# Patient Record
Sex: Female | Born: 1980 | Race: White | Hispanic: No | Marital: Single | Smoking: Current every day smoker
Health system: Southern US, Community
[De-identification: ages and names within clinical notes are randomized; demographics above are authoritative.]

## PROBLEM LIST (undated history)

## (undated) DIAGNOSIS — F419 Anxiety disorder, unspecified: Secondary | ICD-10-CM

## (undated) DIAGNOSIS — L309 Dermatitis, unspecified: Secondary | ICD-10-CM

## (undated) DIAGNOSIS — G43909 Migraine, unspecified, not intractable, without status migrainosus: Secondary | ICD-10-CM

## (undated) HISTORY — DX: Migraine, unspecified, not intractable, without status migrainosus: G43.909

## (undated) HISTORY — DX: Dermatitis, unspecified: L30.9

## (undated) HISTORY — DX: Anxiety disorder, unspecified: F41.9

---
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
## 2004-03-11 ENCOUNTER — Emergency Department: Payer: Self-pay | Admitting: Emergency Medicine

## 2004-06-18 ENCOUNTER — Emergency Department: Payer: Self-pay | Admitting: Emergency Medicine

## 2006-07-06 ENCOUNTER — Emergency Department: Payer: Self-pay | Admitting: Emergency Medicine

## 2006-11-04 ENCOUNTER — Ambulatory Visit: Payer: Self-pay | Admitting: Family Medicine

## 2009-03-10 ENCOUNTER — Ambulatory Visit: Payer: Self-pay | Admitting: Internal Medicine

## 2009-03-10 ENCOUNTER — Emergency Department: Payer: Self-pay | Admitting: Emergency Medicine

## 2011-08-09 ENCOUNTER — Telehealth: Payer: Self-pay | Admitting: Internal Medicine

## 2011-08-09 MED ORDER — AZITHROMYCIN 250 MG PO TABS: ORAL_TABLET | ORAL | Status: AC

## 2011-08-09 NOTE — Telephone Encounter (Signed)
We can call her in a z-pack.  This will help reduce the risk of transmission of pertussis to others, but will not likely shorten the course. If cough severe, we can call in Tessalon.  She should be seen next week if symptoms severe/persistent.

## 2011-08-09 NOTE — Telephone Encounter (Signed)
Left mess to call office back.  Unsure pharm or pt's allergies.

## 2011-08-09 NOTE — Telephone Encounter (Signed)
Patient is working at [employer].Coughing until vomiting.

## 2011-08-09 NOTE — Telephone Encounter (Signed)
Patient informed, She has allergy to penicillin but no probs w/zpak. She did not want tessalon. I also advised her to report exposure to her work so it can be documented and followed, pt agreed.

## 2012-05-05 ENCOUNTER — Ambulatory Visit (INDEPENDENT_AMBULATORY_CARE_PROVIDER_SITE_OTHER): Payer: BC Managed Care – PPO | Admitting: Internal Medicine

## 2012-05-05 ENCOUNTER — Ambulatory Visit (INDEPENDENT_AMBULATORY_CARE_PROVIDER_SITE_OTHER)
Admission: RE | Admit: 2012-05-05 | Discharge: 2012-05-05 | Disposition: A | Discharge status: Home / Self Care | Payer: BC Managed Care – PPO | Source: Ambulatory Visit | Attending: Internal Medicine | Admitting: Internal Medicine

## 2012-05-05 ENCOUNTER — Encounter: Payer: Self-pay | Admitting: Internal Medicine

## 2012-05-05 VITALS — BP 112/78 | HR 69 | Temp 98.3°F | Resp 16 | Ht 65.75 in | Wt 215.8 lb

## 2012-05-05 DIAGNOSIS — R05 Cough: Secondary | ICD-10-CM | POA: Insufficient documentation

## 2012-05-05 DIAGNOSIS — F17200 Nicotine dependence, unspecified, uncomplicated: Secondary | ICD-10-CM

## 2012-05-05 DIAGNOSIS — G43909 Migraine, unspecified, not intractable, without status migrainosus: Secondary | ICD-10-CM | POA: Insufficient documentation

## 2012-05-05 DIAGNOSIS — Z720 Tobacco use: Secondary | ICD-10-CM

## 2012-05-05 DIAGNOSIS — R059 Cough, unspecified: Secondary | ICD-10-CM

## 2012-05-05 DIAGNOSIS — E669 Obesity, unspecified: Secondary | ICD-10-CM | POA: Insufficient documentation

## 2012-05-05 NOTE — Assessment & Plan Note (Signed)
Strongly encourage smoking cessation. Patient has used both Chantix and Wellbutrin in the past without improvement. Encouraged her to consider nicotine replacement.

## 2012-05-05 NOTE — Progress Notes (Signed)
Subjective:    Patient ID: Kristen Carey, female    DOB: [birth year], 31 y.o.   MRN: [id number]  HPI 31 year old female with history of migraine headaches presents to reestablish care. She was previously seen in our office. She has not been seen in over a year. She reports that she is generally been doing well. However, since February 2013, she has had some issues with cough. She reports that at that time she was infected with pertussis. She was treated with azithromycin. Overall, symptoms have improved however she continues to have approximately 2 episodes per day violent coughing sometimes associated with posttussive emesis. She denies hemoptysis or productive cough. She also notes occasional stridor. She denies any fever, chills, chest pain, shortness of breath either at rest or on exertion. She is a smoker and smokes about 15 cigarettes per day. She has tried Wellbutrin and Chantix to help quit smoking but these have been unsuccessful. She has a family history of lung cancer in her maternal grandmother.  In regards to migraine headaches, symptoms have been well-controlled with Treximet.  Outpatient Encounter Prescriptions as of 05/05/2012  Medication Sig Dispense Refill  . clindamycin (CLEOCIN) 2 % vaginal cream       . TREXIMET 85-500 MG per tablet        BP 112/78  Pulse 69  Temp 98.3 F (36.8 C) (Oral)  Resp 16  Ht 5' 5.75" (1.67 m)  Wt 215 lb 12 oz (97.864 kg)  BMI 35.09 kg/m2  SpO2 98%  Review of Systems  Constitutional: Negative for fever, chills, appetite change, fatigue and unexpected weight change.  HENT: Negative for ear pain, congestion, sore throat, trouble swallowing, neck pain, voice change and sinus pressure.   Eyes: Negative for visual disturbance.  Respiratory: Positive for cough. Negative for shortness of breath, wheezing and stridor.   Cardiovascular: Negative for chest pain, palpitations and leg swelling.  Gastrointestinal: Negative for nausea, vomiting,  abdominal pain, diarrhea, constipation, blood in stool, abdominal distention and anal bleeding.  Genitourinary: Negative for dysuria and flank pain.  Musculoskeletal: Negative for myalgias, arthralgias and gait problem.  Skin: Negative for color change and rash.  Neurological: Negative for dizziness and headaches.  Hematological: Negative for adenopathy. Does not bruise/bleed easily.  Psychiatric/Behavioral: Negative for suicidal ideas, sleep disturbance and dysphoric mood. The patient is not nervous/anxious.        Objective:   Physical Exam  Constitutional: She is oriented to person, place, and time. She appears well-developed and well-nourished. No distress.  HENT:  Head: Normocephalic and atraumatic.  Right Ear: External ear normal.  Left Ear: External ear normal.  Nose: Nose normal.  Mouth/Throat: Oropharynx is clear and moist. No oropharyngeal exudate.  Eyes: Conjunctivae normal are normal. Pupils are equal, round, and reactive to light. Right eye exhibits no discharge. Left eye exhibits no discharge. No scleral icterus.  Neck: Normal range of motion. Neck supple. No tracheal deviation present. No thyromegaly present.  Cardiovascular: Normal rate, regular rhythm, normal heart sounds and intact distal pulses.  Exam reveals no gallop and no friction rub.   No murmur heard. Pulmonary/Chest: Effort normal and breath sounds normal. No accessory muscle usage. Not tachypneic. No respiratory distress. She has no decreased breath sounds. She has no wheezes. She has no rhonchi. She has no rales. She exhibits no tenderness.  Abdominal: Soft. Bowel sounds are normal. She exhibits no distension and no mass. There is no tenderness. There is no guarding.  Musculoskeletal: Normal range of motion. She exhibits no  edema and no tenderness.  Lymphadenopathy:    She has no cervical adenopathy.  Neurological: She is alert and oriented to person, place, and time. No cranial nerve deficit. She exhibits  normal muscle tone. Coordination normal.  Skin: Skin is warm and dry. No rash noted. She is not diaphoretic. No erythema. No pallor.  Psychiatric: She has a normal mood and affect. Her behavior is normal. Judgment and thought content normal.          Assessment & Plan:

## 2012-05-05 NOTE — Assessment & Plan Note (Addendum)
Symptoms seem most consistent with residual cough from pertussis.  Exam is normal today. CXR normal today.  Given h/o tobacco abuse would favor getting PFTs for further evaluation.  Strongly encouraged smoking cessation given pt family h/o lung cancer.  Follow up 1 month.

## 2012-05-05 NOTE — Assessment & Plan Note (Signed)
BMI 35. Patient reports that recent lab work performed by her OB/GYN included CMP and TSH. Will request results on this.

## 2012-05-05 NOTE — Assessment & Plan Note (Signed)
Migraines are well controlled with Treximet. Will plan to continue.

## 2012-06-25 ENCOUNTER — Encounter: Payer: Self-pay | Admitting: Internal Medicine

## 2012-06-25 ENCOUNTER — Ambulatory Visit (INDEPENDENT_AMBULATORY_CARE_PROVIDER_SITE_OTHER)
Admission: RE | Admit: 2012-06-25 | Discharge: 2012-06-25 | Disposition: A | Discharge status: Home / Self Care | Payer: BC Managed Care – PPO | Source: Ambulatory Visit | Attending: Internal Medicine | Admitting: Internal Medicine

## 2012-06-25 ENCOUNTER — Ambulatory Visit (INDEPENDENT_AMBULATORY_CARE_PROVIDER_SITE_OTHER): Payer: BC Managed Care – PPO | Admitting: Internal Medicine

## 2012-06-25 VITALS — BP 128/90 | HR 80 | Temp 98.6°F | Wt 212.0 lb

## 2012-06-25 DIAGNOSIS — M542 Cervicalgia: Secondary | ICD-10-CM

## 2012-06-25 MED ORDER — CYCLOBENZAPRINE HCL 5 MG PO TABS: 5.0000 mg | ORAL_TABLET | Freq: Three times a day (TID) | ORAL | Status: DC | PRN

## 2012-06-25 MED ORDER — HYDROCODONE-ACETAMINOPHEN 5-325 MG PO TABS: 1.0000 | ORAL_TABLET | Freq: Four times a day (QID) | ORAL | Status: DC | PRN

## 2012-06-25 NOTE — Progress Notes (Signed)
  Subjective:    Patient ID: Kristen Carey, female    DOB: 1980/07/06, 32 y.o.   MRN: [id number]  HPI 31YO female presents with 1 week h/o neck pain. Woke up Friday morning with pain in back and left side of neck. Pain shoots down left shoulder. No arm numbness or weakness. No trauma, no new activites. Taking ibuprofen with no improvement and took 1 hydrocodone with minimal improvement. Decreaesd ROM. No fever, headache, or other symptoms.  Outpatient Prescriptions Prior to Visit  Medication Sig Dispense Refill  . TREXIMET 85-500 MG per tablet       . clindamycin (CLEOCIN) 2 % vaginal cream        Last reviewed on 06/25/2012  8:20 AM by Theola Sequin  BP 128/90  Pulse 80  Temp 98.6 F (37 C) (Oral)  Wt 212 lb (96.163 kg)  SpO2 98%  Review of Systems  Constitutional: Negative for fever, chills, appetite change, fatigue and unexpected weight change.  HENT: Positive for neck pain. Negative for ear pain, congestion, sore throat, trouble swallowing, neck stiffness, voice change and sinus pressure.   Eyes: Negative for visual disturbance.  Respiratory: Negative for cough, shortness of breath, wheezing and stridor.   Cardiovascular: Negative for chest pain, palpitations and leg swelling.  Gastrointestinal: Negative for nausea, vomiting, abdominal pain, diarrhea, constipation, blood in stool, abdominal distention and anal bleeding.  Genitourinary: Negative for dysuria and flank pain.  Musculoskeletal: Positive for myalgias and back pain. Negative for arthralgias and gait problem.  Skin: Negative for color change and rash.  Neurological: Negative for dizziness and headaches.  Hematological: Negative for adenopathy. Does not bruise/bleed easily.  Psychiatric/Behavioral: Negative for suicidal ideas, sleep disturbance and dysphoric mood. The patient is not nervous/anxious.        Objective:   Physical Exam  Constitutional: She is oriented to person, place, and time. She appears  well-developed and well-nourished. No distress.  HENT:  Head: Normocephalic and atraumatic.  Right Ear: External ear normal.  Left Ear: External ear normal.  Nose: Nose normal.  Mouth/Throat: Oropharynx is clear and moist.  Eyes: Conjunctivae normal are normal. Pupils are equal, round, and reactive to light. Right eye exhibits no discharge. Left eye exhibits no discharge. No scleral icterus.  Neck: Normal range of motion. Neck supple. Muscular tenderness (left trapezius) present. No spinous process tenderness present. No rigidity. No tracheal deviation and normal range of motion (pain with movement, but no decreased in ROM) present. No Brudzinski's sign and no Kernig's sign noted. No mass and no thyromegaly present.    Pulmonary/Chest: Effort normal.  Musculoskeletal: She exhibits no edema and no tenderness.       Cervical back: She exhibits tenderness, pain and spasm. She exhibits normal range of motion.  Lymphadenopathy:    She has no cervical adenopathy.  Neurological: She is alert and oriented to person, place, and time. No cranial nerve deficit. She exhibits normal muscle tone. Coordination normal.  Skin: Skin is warm and dry. No rash noted. She is not diaphoretic. No erythema. No pallor.  Psychiatric: She has a normal mood and affect. Her behavior is normal. Judgment and thought content normal.          Assessment & Plan:

## 2012-06-25 NOTE — Assessment & Plan Note (Addendum)
Symptoms of left neck pain most consistent with muscular spasm. Continue NSAIDS. Will start prn flexeril and hydrocodone for severe pain. Pt will not drive when taking these medications. Plain films normal today. If symptoms persistent, then will set up referral to Dr. Yves Dill with pain management.

## 2012-07-05 ENCOUNTER — Other Ambulatory Visit: Payer: Self-pay

## 2012-08-26 ENCOUNTER — Encounter: Payer: Self-pay | Admitting: Internal Medicine

## 2012-12-21 ENCOUNTER — Encounter: Payer: Self-pay | Admitting: Adult Health

## 2012-12-21 ENCOUNTER — Ambulatory Visit (INDEPENDENT_AMBULATORY_CARE_PROVIDER_SITE_OTHER): Payer: BC Managed Care – PPO | Admitting: Adult Health

## 2012-12-21 VITALS — BP 118/74 | HR 71 | Temp 98.1°F | Resp 12 | Wt 203.0 lb

## 2012-12-21 DIAGNOSIS — R05 Cough: Secondary | ICD-10-CM

## 2012-12-21 MED ORDER — FLUCONAZOLE 150 MG PO TABS: 150.0000 mg | ORAL_TABLET | Freq: Once | ORAL | Status: DC

## 2012-12-21 MED ORDER — AZITHROMYCIN 250 MG PO TABS: ORAL_TABLET | ORAL | Status: DC

## 2012-12-21 NOTE — Patient Instructions (Addendum)
  Start the Azithromycin today.  Use over the counter cough medication as needed.  Drink plenty of fluids to stay hydrated.  Call if symptoms are not improved within 3-4 days.

## 2012-12-21 NOTE — Progress Notes (Signed)
  Subjective:    Patient ID: Kristen Carey, female    DOB: February 25, 1981, 32 y.o.   MRN: [id number]  HPI  Patient presents to clinic with cough which began on Thursday. She reports nasal congestion and drainage. Cough is mainly dry but occasionally brings up dark green or brownish secretion. She reports wheezing. No shortness of breath. She has had chills and but does not know if she has had temp.   Current Outpatient Prescriptions on File Prior to Visit  Medication Sig Dispense Refill  . clindamycin (CLEOCIN) 2 % vaginal cream       . TREXIMET 85-500 MG per tablet Take 1 tablet by mouth as needed.        No current facility-administered medications on file prior to visit.     Review of Systems  Constitutional: Positive for chills. Negative for fever.  HENT: Positive for congestion, sore throat, rhinorrhea and postnasal drip.   Respiratory: Positive for cough and wheezing. Negative for shortness of breath.   Cardiovascular: Negative.    BP 118/74  Pulse 71  Temp(Src) 98.1 F (36.7 C) (Oral)  Resp 12  Wt 203 lb (92.08 kg)  BMI 33.02 kg/m2  SpO2 98%    Objective:   Physical Exam  Constitutional: She is oriented to person, place, and time. She appears well-developed and well-nourished.  HENT:  Head: Normocephalic and atraumatic.  Right Ear: External ear normal.  Left Ear: External ear normal.  Pharyngeal erythema without exudate  Eyes: Conjunctivae are normal. Pupils are equal, round, and reactive to light.  Neck: Normal range of motion. Neck supple. No tracheal deviation present.  Cardiovascular: Normal rate, regular rhythm, normal heart sounds and intact distal pulses.  Exam reveals no gallop and no friction rub.   No murmur heard. Pulmonary/Chest: Effort normal and breath sounds normal. No respiratory distress. She has no wheezes. She has no rales. She exhibits no tenderness.  Lymphadenopathy:    She has no cervical adenopathy.  Neurological: She is alert and oriented to  person, place, and time.  Skin: Skin is warm and dry.  Psychiatric: She has a normal mood and affect. Her behavior is normal. Judgment and thought content normal.      Assessment & Plan:

## 2012-12-21 NOTE — Assessment & Plan Note (Signed)
Start azithromycin. OTC cough medicine. RTC if symptoms not improved within 3-4 days.

## 2013-03-25 ENCOUNTER — Other Ambulatory Visit: Payer: Self-pay

## 2013-05-20 DIAGNOSIS — E538 Deficiency of other specified B group vitamins: Secondary | ICD-10-CM | POA: Insufficient documentation

## 2013-08-30 ENCOUNTER — Telehealth: Payer: Self-pay | Admitting: Internal Medicine

## 2013-08-30 NOTE — Telephone Encounter (Signed)
Patient was sent Mychart message to call office to discuss further.

## 2013-08-30 NOTE — Telephone Encounter (Signed)
See below my chart message Is it ok to wait to may  isit Type: MYCHART OFFICE VISIT (1064)      09/22/2013 8:15 AM 15 mins. Kristen MediaJennifer A Walker, MD [facility]       Patient Comments:   Office Visit   Fatigue ans swelling

## 2013-09-22 ENCOUNTER — Encounter: Payer: Self-pay | Admitting: Internal Medicine

## 2013-09-22 ENCOUNTER — Ambulatory Visit (INDEPENDENT_AMBULATORY_CARE_PROVIDER_SITE_OTHER): Payer: No Typology Code available for payment source | Admitting: Internal Medicine

## 2013-09-22 VITALS — BP 116/80 | HR 80 | Temp 98.7°F | Wt 214.0 lb

## 2013-09-22 DIAGNOSIS — R609 Edema, unspecified: Secondary | ICD-10-CM

## 2013-09-22 DIAGNOSIS — R002 Palpitations: Secondary | ICD-10-CM

## 2013-09-22 LAB — MICROALBUMIN / CREATININE URINE RATIO
CREATININE, U: 94.8 mg/dL
MICROALB/CREAT RATIO: 0.3 mg/g (ref 0.0–30.0)
Microalb, Ur: 0.3 mg/dL (ref 0.0–1.9)

## 2013-09-22 LAB — CBC WITH DIFFERENTIAL/PLATELET
Basophils Absolute: 0 10*3/uL (ref 0.0–0.1)
Basophils Relative: 0.5 % (ref 0.0–3.0)
EOS PCT: 1.6 % (ref 0.0–5.0)
Eosinophils Absolute: 0.1 10*3/uL (ref 0.0–0.7)
HEMATOCRIT: 41.1 % (ref 36.0–46.0)
HEMOGLOBIN: 13.8 g/dL (ref 12.0–15.0)
LYMPHS ABS: 2.3 10*3/uL (ref 0.7–4.0)
LYMPHS PCT: 26.9 % (ref 12.0–46.0)
MCHC: 33.7 g/dL (ref 30.0–36.0)
MCV: 93.7 fl (ref 78.0–100.0)
MONOS PCT: 7.4 % (ref 3.0–12.0)
Monocytes Absolute: 0.6 10*3/uL (ref 0.1–1.0)
Neutro Abs: 5.5 10*3/uL (ref 1.4–7.7)
Neutrophils Relative %: 63.6 % (ref 43.0–77.0)
PLATELETS: 229 10*3/uL (ref 150.0–400.0)
RBC: 4.39 Mil/uL (ref 3.87–5.11)
RDW: 13.3 % (ref 11.5–15.5)
WBC: 8.6 10*3/uL (ref 4.0–10.5)

## 2013-09-22 LAB — LIPID PANEL
CHOLESTEROL: 182 mg/dL (ref 0–200)
HDL: 33.1 mg/dL — ABNORMAL LOW (ref >39.00)
LDL Cholesterol: 129 mg/dL — ABNORMAL HIGH (ref 0–99)
TRIGLYCERIDES: 101 mg/dL (ref 0.0–149.0)
Total CHOL/HDL Ratio: 5
VLDL: 20.2 mg/dL (ref 0.0–40.0)

## 2013-09-22 LAB — COMPREHENSIVE METABOLIC PANEL
ALBUMIN: 3.7 g/dL (ref 3.5–5.2)
ALT: 30 U/L (ref 0–35)
AST: 24 U/L (ref 0–37)
Alkaline Phosphatase: 98 U/L (ref 39–117)
BUN: 8 mg/dL (ref 6–23)
CALCIUM: 9.1 mg/dL (ref 8.4–10.5)
CHLORIDE: 108 meq/L (ref 96–112)
CO2: 25 meq/L (ref 19–32)
CREATININE: 0.7 mg/dL (ref 0.4–1.2)
GFR: 104 mL/min (ref >60.00)
GLUCOSE: 94 mg/dL (ref 70–99)
Potassium: 4.1 mEq/L (ref 3.5–5.1)
Sodium: 138 mEq/L (ref 135–145)
Total Bilirubin: 0.2 mg/dL (ref 0.2–1.2)
Total Protein: 6.6 g/dL (ref 6.0–8.3)

## 2013-09-22 LAB — T4, FREE: Free T4: 0.75 ng/dL (ref 0.60–1.60)

## 2013-09-22 LAB — VITAMIN B12: VITAMIN B 12: 303 pg/mL (ref 211–911)

## 2013-09-22 LAB — TSH: TSH: 2.86 u[IU]/mL (ref 0.35–4.50)

## 2013-09-22 LAB — HEMOGLOBIN A1C: Hgb A1c MFr Bld: 5.9 % (ref 4.6–6.5)

## 2013-09-22 NOTE — Progress Notes (Signed)
Pre visit review using our clinic review tool, if applicable. No additional management support is needed unless otherwise documented below in the visit note. 

## 2013-09-22 NOTE — Progress Notes (Signed)
Subjective:    Patient ID: Kristen Carey, female    DOB: [birth year], 33 y.o.   MRN: [id number]  HPI 33YO female presents for acute visit.  Concerned about "puffiness" and edema for several months. Swelling is diffuse, face arms legs. No dyspnea or chest pain. Occasional "fluttering" heart palpitations. Feels fatigued. Having intermittent constipation and diarrhea. Taking probiotics with minimal improvement. Notes temp intolerance mostly to hot temps. Sleeps about 9hr per night, but wakes feeling tired. Had sleep study several years ago which was normal. No recent headaches. Trying to follow healthy diet. Not exercising. Working full time as Engineer, civil (consulting)nurse in pediatrics.   Review of Systems  Constitutional: Negative for fever, chills, appetite change, fatigue and unexpected weight change.  HENT: Negative for congestion, ear pain, sinus pressure, sore throat, trouble swallowing and voice change.   Eyes: Negative for visual disturbance.  Respiratory: Negative for cough, shortness of breath, wheezing and stridor.   Cardiovascular: Positive for leg swelling. Negative for chest pain and palpitations.  Gastrointestinal: Positive for diarrhea and constipation. Negative for nausea, vomiting, abdominal pain, blood in stool, abdominal distention and anal bleeding.  Endocrine: Positive for cold intolerance and heat intolerance. Negative for polydipsia, polyphagia and polyuria.  Genitourinary: Negative for dysuria and flank pain.  Musculoskeletal: Negative for arthralgias, gait problem, myalgias and neck pain.  Skin: Negative for color change and rash.  Neurological: Negative for dizziness and headaches.  Hematological: Negative for adenopathy. Does not bruise/bleed easily.  Psychiatric/Behavioral: Negative for suicidal ideas, sleep disturbance and dysphoric mood. The patient is not nervous/anxious.        Objective:    BP 116/80  Pulse 80  Temp(Src) 98.7 F (37.1 C) (Oral)  Wt 214 lb (97.07  kg)  SpO2 97% Physical Exam  Constitutional: She is oriented to person, place, and time. She appears well-developed and well-nourished. No distress.  HENT:  Head: Normocephalic and atraumatic.  Right Ear: External ear normal.  Left Ear: External ear normal.  Nose: Nose normal.  Mouth/Throat: Oropharynx is clear and moist. No oropharyngeal exudate.  Eyes: Conjunctivae are normal. Pupils are equal, round, and reactive to light. Right eye exhibits no discharge. Left eye exhibits no discharge. No scleral icterus.  Neck: Normal range of motion. Neck supple. Carotid bruit is not present. No tracheal deviation present. No mass and no thyromegaly present.  Cardiovascular: Normal rate, regular rhythm, normal heart sounds and intact distal pulses.  Exam reveals no gallop and no friction rub.   No murmur heard. Pulmonary/Chest: Effort normal and breath sounds normal. No accessory muscle usage. Not tachypneic. No respiratory distress. She has no decreased breath sounds. She has no wheezes. She has no rhonchi. She has no rales. She exhibits no tenderness.  Abdominal: Soft. Bowel sounds are normal. She exhibits no distension and no mass. There is no tenderness. There is no rebound and no guarding.  Musculoskeletal: Normal range of motion. She exhibits no edema and no tenderness.  Lymphadenopathy:    She has no cervical adenopathy.  Neurological: She is alert and oriented to person, place, and time. No cranial nerve deficit. She exhibits normal muscle tone. Coordination normal.  Skin: Skin is warm and dry. No rash noted. She is not diaphoretic. No erythema. No pallor.  Psychiatric: She has a normal mood and affect. Her behavior is normal. Judgment and thought content normal.          Assessment & Plan:   Problem List Items Addressed This Visit   Edema - Primary  Unclear etiology for described "puffiness" and fatigue. Exam today is normal. Will check CMP, TSH, CBC with labs. Consider repeat sleep  study if labs are normal.    Relevant Orders      TSH      T4, free      Comprehensive metabolic panel      Hemoglobin A1c      Lipid panel      Microalbumin / creatinine urine ratio      CBC with Differential      B12   Palpitations     Recent episodes of palpitations. Exam remarkable for frequent extrasystoles. EKG is normal. Will check electrolytes, TSH, CBC with labs today.    Relevant Orders      EKG 12-Lead (Completed)       Return in about 2 weeks (around 10/06/2013).

## 2013-09-22 NOTE — Assessment & Plan Note (Signed)
Unclear etiology for described "puffiness" and fatigue. Exam today is normal. Will check CMP, TSH, CBC with labs. Consider repeat sleep study if labs are normal.

## 2013-09-22 NOTE — Assessment & Plan Note (Signed)
Recent episodes of palpitations. Exam remarkable for frequent extrasystoles. EKG is normal. Will check electrolytes, TSH, CBC with labs today.

## 2013-09-22 NOTE — Telephone Encounter (Signed)
Please advise 

## 2013-09-23 ENCOUNTER — Telehealth: Payer: Self-pay | Admitting: Internal Medicine

## 2013-09-23 NOTE — Telephone Encounter (Signed)
Relevant patient education mailed to patient.  

## 2013-09-24 ENCOUNTER — Encounter: Payer: Self-pay | Admitting: Internal Medicine

## 2013-09-24 MED ORDER — CYANOCOBALAMIN 1000 MCG/ML IJ SOLN: 1000.0000 ug | INTRAMUSCULAR | Status: DC

## 2014-02-25 ENCOUNTER — Encounter: Payer: Self-pay | Admitting: Internal Medicine

## 2014-03-02 ENCOUNTER — Ambulatory Visit (INDEPENDENT_AMBULATORY_CARE_PROVIDER_SITE_OTHER): Payer: No Typology Code available for payment source | Admitting: Internal Medicine

## 2014-03-02 ENCOUNTER — Encounter: Payer: Self-pay | Admitting: Internal Medicine

## 2014-03-02 VITALS — BP 112/80 | HR 70 | Temp 98.4°F | Ht 65.75 in | Wt 214.0 lb

## 2014-03-02 DIAGNOSIS — R002 Palpitations: Secondary | ICD-10-CM

## 2014-03-02 DIAGNOSIS — R011 Cardiac murmur, unspecified: Secondary | ICD-10-CM

## 2014-03-02 DIAGNOSIS — E669 Obesity, unspecified: Secondary | ICD-10-CM

## 2014-03-02 DIAGNOSIS — Z720 Tobacco use: Secondary | ICD-10-CM

## 2014-03-02 NOTE — Progress Notes (Signed)
Subjective:    Patient ID: Kristen ShuttersMargaret J Metheny, female    DOB: [birth year], 33 y.o.   MRN: [id number]  HPI 33YO female presents for acute visit.  Seen by OB. Told she had heart murmur. Occasionally over the last several months, feels heart skip a beat with some tightness across chest. This resolves in seconds without intervention. Continues to have some fatigue, slightly improved with B12. She continues to smoke. She does not exercise generally.   Review of Systems  Constitutional: Positive for fatigue. Negative for fever, chills, appetite change and unexpected weight change.  Eyes: Negative for visual disturbance.  Respiratory: Positive for chest tightness. Negative for shortness of breath.   Cardiovascular: Positive for palpitations. Negative for chest pain and leg swelling.  Gastrointestinal: Negative for abdominal pain.  Skin: Negative for color change and rash.  Hematological: Negative for adenopathy. Does not bruise/bleed easily.  Psychiatric/Behavioral: Negative for dysphoric mood. The patient is not nervous/anxious.        Objective:    BP 112/80  Pulse 70  Temp(Src) 98.4 F (36.9 C) (Oral)  Ht 5' 5.75" (1.67 m)  Wt 214 lb (97.07 kg)  BMI 34.81 kg/m2  SpO2 97% Physical Exam  Constitutional: She is oriented to person, place, and time. She appears well-developed and well-nourished. No distress.  HENT:  Head: Normocephalic and atraumatic.  Right Ear: External ear normal.  Left Ear: External ear normal.  Nose: Nose normal.  Mouth/Throat: Oropharynx is clear and moist. No oropharyngeal exudate.  Eyes: Conjunctivae are normal. Pupils are equal, round, and reactive to light. Right eye exhibits no discharge. Left eye exhibits no discharge. No scleral icterus.  Neck: Normal range of motion. Neck supple. No tracheal deviation present. No thyromegaly present.  Cardiovascular: Normal rate, regular rhythm and intact distal pulses.  Exam reveals no gallop and no friction rub.     Murmur (right sternal border) heard. Pulmonary/Chest: Effort normal and breath sounds normal. No accessory muscle usage. Not tachypneic. No respiratory distress. She has no decreased breath sounds. She has no wheezes. She has no rhonchi. She has no rales. She exhibits no tenderness.  Musculoskeletal: Normal range of motion. She exhibits no edema and no tenderness.  Lymphadenopathy:    She has no cervical adenopathy.  Neurological: She is alert and oriented to person, place, and time. No cranial nerve deficit. She exhibits normal muscle tone. Coordination normal.  Skin: Skin is warm and dry. No rash noted. She is not diaphoretic. No erythema. No pallor.  Psychiatric: She has a normal mood and affect. Her behavior is normal. Judgment and thought content normal.          Assessment & Plan:   Problem List Items Addressed This Visit     Unprioritized   Heart murmur - Primary     Heart murmur noted on exam today with slightly loud S1. Will set up ECHO for evaluation.    Relevant Orders      2D Echocardiogram without contrast   Obesity (BMI 30-39.9)      Wt Readings from Last 3 Encounters:  03/02/14 214 lb (97.07 kg)  09/22/13 214 lb (97.07 kg)  12/21/12 203 lb (92.08 kg)   Encouraged healthy diet and exercise with goal of weight loss.    Palpitations     Several month h/o intermittent palpitations. Will check ECHO. Recent labs including electrolytes, thyroid function, normal. We discussed referral to cardiology for evaluation and possible Holter, but she prefers to hold off for now.  Tobacco abuse     Encouraged smoking cessation.        Return in about 4 weeks (around 03/30/2014) for Recheck.

## 2014-03-02 NOTE — Patient Instructions (Signed)
We will set up an ECHO.  Follow up in 4 weeks.

## 2014-03-02 NOTE — Assessment & Plan Note (Signed)
Wt Readings from Last 3 Encounters:  03/02/14 214 lb (97.07 kg)  09/22/13 214 lb (97.07 kg)  12/21/12 203 lb (92.08 kg)   Encouraged healthy diet and exercise with goal of weight loss.

## 2014-03-02 NOTE — Assessment & Plan Note (Signed)
Several month h/o intermittent palpitations. Will check ECHO. Recent labs including electrolytes, thyroid function, normal. We discussed referral to cardiology for evaluation and possible Holter, but she prefers to hold off for now.

## 2014-03-02 NOTE — Assessment & Plan Note (Signed)
Heart murmur noted on exam today with slightly loud S1. Will set up ECHO for evaluation.

## 2014-03-02 NOTE — Assessment & Plan Note (Signed)
Encouraged smoking cessation 

## 2014-03-02 NOTE — Progress Notes (Signed)
Pre visit review using our clinic review tool, if applicable. No additional management support is needed unless otherwise documented below in the visit note. 

## 2014-03-03 ENCOUNTER — Telehealth: Payer: Self-pay | Admitting: Internal Medicine

## 2014-03-03 NOTE — Telephone Encounter (Signed)
emmi emailed °

## 2014-03-17 ENCOUNTER — Other Ambulatory Visit: Payer: Self-pay

## 2014-03-17 ENCOUNTER — Other Ambulatory Visit (INDEPENDENT_AMBULATORY_CARE_PROVIDER_SITE_OTHER): Payer: No Typology Code available for payment source

## 2014-03-17 DIAGNOSIS — R011 Cardiac murmur, unspecified: Secondary | ICD-10-CM

## 2014-04-20 ENCOUNTER — Encounter: Payer: Self-pay | Admitting: Internal Medicine

## 2014-04-20 NOTE — Telephone Encounter (Signed)
Added 9:00 tomorrow per Dr. walker

## 2014-04-21 ENCOUNTER — Ambulatory Visit (INDEPENDENT_AMBULATORY_CARE_PROVIDER_SITE_OTHER): Payer: No Typology Code available for payment source | Admitting: Internal Medicine

## 2014-04-21 ENCOUNTER — Encounter: Payer: Self-pay | Admitting: Internal Medicine

## 2014-04-21 VITALS — BP 118/80 | HR 69 | Temp 98.3°F | Ht 65.75 in | Wt 208.0 lb

## 2014-04-21 DIAGNOSIS — L03818 Cellulitis of other sites: Secondary | ICD-10-CM

## 2014-04-21 MED ORDER — DOXYCYCLINE HYCLATE 100 MG PO TABS
100.0000 mg | ORAL_TABLET | Freq: Two times a day (BID) | ORAL | Status: DC
Start: 2014-04-21 — End: 2014-12-28

## 2014-04-21 MED ORDER — GENTAMICIN SULFATE 0.1 % EX OINT: 1.0000 "application " | TOPICAL_OINTMENT | Freq: Two times a day (BID) | CUTANEOUS | Status: DC

## 2014-04-21 NOTE — Progress Notes (Signed)
Pre visit review using our clinic review tool, if applicable. No additional management support is needed unless otherwise documented below in the visit note. 

## 2014-04-21 NOTE — Assessment & Plan Note (Signed)
Exam most consistent with early folliculitis right groin with surrounding cellulitis. Will start oral Doxycycline and will start topical gentamicin ointment to area.

## 2014-04-21 NOTE — Progress Notes (Signed)
   Subjective:    Patient ID: Kristen ShuttersMargaret J Carey, female    DOB: 1980/06/11, 33 y.o.   MRN: [id number]  HPI 33YO female presents for acute visit.  Developed lesion in nose 4 days ago. Noted some puffiness but no redness. 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 Has been bathing hibaclens and putting bactroban in nose. Developed skin lesion in groin about 1.5 weeks ago. Minimal drainage after incising with needle. No fever, chills. Took one dose of Suprax from her office.   Review of Systems  Constitutional: Negative for fever, chills, appetite change, fatigue and unexpected weight change.  Eyes: Negative for visual disturbance.  Respiratory: Negative for shortness of breath.   Cardiovascular: Negative for chest pain and leg swelling.  Gastrointestinal: Negative for abdominal pain.  Skin: Positive for color change and wound. Negative for rash.  Hematological: Negative for adenopathy. Does not bruise/bleed easily.  Psychiatric/Behavioral: Negative for dysphoric mood. The patient is not nervous/anxious.        Objective:    BP 118/80 mmHg  Pulse 69  Temp(Src) 98.3 F (36.8 C) (Oral)  Ht 5' 5.75" (1.67 m)  Wt 208 lb (94.348 kg)  BMI 33.83 kg/m2  SpO2 99% Physical Exam  Constitutional: She is oriented to person, place, and time. She appears well-developed and well-nourished. No distress.  HENT:  Head: Normocephalic and atraumatic.  Right Ear: External ear normal.  Left Ear: External ear normal.  Nose: Mucosal edema present.    Mouth/Throat: Oropharynx is clear and moist. No oropharyngeal exudate.  Eyes: Conjunctivae are normal. Pupils are equal, round, and reactive to light. Right eye exhibits no discharge. Left eye exhibits no discharge. No scleral icterus.  Neck: Normal range of motion. Neck supple. No tracheal deviation present. No thyromegaly present.  Cardiovascular: Normal rate, regular rhythm, normal heart sounds and intact distal  pulses.  Exam reveals no gallop and no friction rub.   No murmur heard. Pulmonary/Chest: Effort normal and breath sounds normal. No accessory muscle usage. No tachypnea. No respiratory distress. She has no decreased breath sounds. She has no wheezes. She has no rhonchi. She has no rales. She exhibits no tenderness.  Musculoskeletal: Normal range of motion. She exhibits no edema or tenderness.  Lymphadenopathy:    She has no cervical adenopathy.  Neurological: She is alert and oriented to person, place, and time. No cranial nerve deficit. She exhibits normal muscle tone. Coordination normal.  Skin: Skin is warm and dry. No rash noted. She is not diaphoretic. No erythema. No pallor.     Psychiatric: She has a normal mood and affect. Her behavior is normal. Judgment and thought content normal.          Assessment & Plan:   Problem List Items Addressed This Visit      Unprioritized   Cellulitis of other specified site - Primary    Exam most consistent with early folliculitis right groin with surrounding cellulitis. Will start oral Doxycycline and will start topical gentamicin ointment to area.    Relevant Medications      gentamicin (GARAMYCIN) ointment 0.1%      DOXYCYCLINE HYCLATE 100 MG PO TABS       Return if symptoms worsen or fail to improve.

## 2014-04-21 NOTE — Patient Instructions (Signed)
Email with update Monday.

## 2014-04-24 ENCOUNTER — Encounter: Payer: Self-pay | Admitting: Internal Medicine

## 2014-12-14 ENCOUNTER — Encounter: Payer: Self-pay | Admitting: Internal Medicine

## 2014-12-28 ENCOUNTER — Ambulatory Visit (INDEPENDENT_AMBULATORY_CARE_PROVIDER_SITE_OTHER): Payer: No Typology Code available for payment source | Admitting: Family Medicine

## 2014-12-28 ENCOUNTER — Telehealth: Payer: Self-pay

## 2014-12-28 ENCOUNTER — Encounter: Payer: Self-pay | Admitting: Family Medicine

## 2014-12-28 VITALS — BP 114/82 | HR 90 | Temp 98.5°F | Ht 65.75 in | Wt 208.5 lb

## 2014-12-28 DIAGNOSIS — R0789 Other chest pain: Secondary | ICD-10-CM | POA: Diagnosis not present

## 2014-12-28 LAB — CBC
HEMATOCRIT: 41.7 % (ref 36.0–46.0)
Hemoglobin: 13.9 g/dL (ref 12.0–15.0)
MCHC: 33.4 g/dL (ref 30.0–36.0)
MCV: 95.2 fl (ref 78.0–100.0)
Platelets: 254 10*3/uL (ref 150.0–400.0)
RBC: 4.39 Mil/uL (ref 3.87–5.11)
RDW: 13 % (ref 11.5–15.5)
WBC: 7.4 10*3/uL (ref 4.0–10.5)

## 2014-12-28 LAB — BASIC METABOLIC PANEL
BUN: 8 mg/dL (ref 6–23)
CALCIUM: 9.8 mg/dL (ref 8.4–10.5)
CHLORIDE: 104 meq/L (ref 96–112)
CO2: 27 mEq/L (ref 19–32)
CREATININE: 0.65 mg/dL (ref 0.40–1.20)
GFR: 110.58 mL/min (ref >60.00)
Glucose, Bld: 98 mg/dL (ref 70–99)
Potassium: 4.4 mEq/L (ref 3.5–5.1)
Sodium: 138 mEq/L (ref 135–145)

## 2014-12-28 LAB — D-DIMER, QUANTITATIVE: D-Dimer, Quant: 0.3 ug/mL-FEU (ref 0.00–0.48)

## 2014-12-28 NOTE — Assessment & Plan Note (Signed)
New problem.  DDx: MSK,Ischemia GERD, PE. EKG ordered and obtained today.  I personally reviewed and interpreted the EKG. Interpretation: Normal sinus rhythm at a rate of 70. Normal intervals. No ST or T wave changes suggestive of ischemia. Normal EKG. Ischemia/heart disease unlikely in the setting of normal EKG and prior normal echo.  Patient denies any symptoms of reflux/GERD although she could have silent reflux which could be contributing.  Patient is concerned about potential PE as she is a smoker. Wells score 0 making this unlikely. Will obtain d-dimer to rule out PE. I advised the patient that this does not appear to be ischemic. Will call patient with results of d-dimer. Obtaining CBC and metabolic panel as well.  Advised use of Prilosec as there could possibly a component of reflux. I suspect that this is more likely MSK in nature.

## 2014-12-28 NOTE — Progress Notes (Signed)
Subjective:  Patient ID: Kristen Carey, female    DOB: 1981/03/16  Age: 34 y.o. MRN: [id number]  CC: Chest pain  HPI  34 year old female with a past medical history of obesity and tobacco abuse presents for acute visit with complaints of chest pain.  1) Chest pain  Patient reports that she has had intermittent chest discomfort for approximately a month.  She has been ignoring this.  She states that it recently worsened as of this morning.  Pain is located less of the chest.  She reports the pain is currently mild in severity (3-4/10). She describes it as achy.  Then an inciting factor. No exacerbating or relieving factors.  No radiation.  No association with exertion.  No associated shortness of breath. No recent change in his activity. No fevers or chills. She does have a chronic productive cough as she is a current smoker.  Social Hx - Reviewed. Current every day smoker.  Social History   Social History  . Marital Status: Single    Spouse Name: N/A  . Number of Children: N/A  . Years of Education: N/A   Social History Main Topics  . Smoking status: Current Every Day Smoker -- 1.00 packs/day  . Smokeless tobacco: Never Used  . Alcohol Use: 0.0 oz/week    0 Standard drinks or equivalent per week  . Drug Use: No  . Sexual Activity: Yes    Birth Control/ Protection: IUD   Other Topics Concern  . None   Social History Narrative   Lives in [place], with children 14YO and 10YO and boyfriend. 2 dogs.      Work - [employer]   Review of Systems  Constitutional: Negative for fever and chills.  Respiratory: Positive for cough and chest tightness. Negative for shortness of breath.   Cardiovascular: Positive for chest pain.    Objective:  BP 114/82 mmHg  Pulse 90  Temp(Src) 98.5 F (36.9 C) (Oral)  Ht 5' 5.75" (1.67 m)  Wt 208 lb 8 oz (94.575 kg)  BMI 33.91 kg/m2  SpO2 99%  BP/Weight 12/28/2014 04/21/2014 03/02/2014  Systolic BP 114 118 112    Diastolic BP 82 80 80  Wt. (Lbs) 208.5 208 214  BMI 33.91 33.83 34.81   Physical Exam  Constitutional: She is oriented to person, place, and time. She appears well-developed and well-nourished. No distress.  Cardiovascular: Normal rate and regular rhythm.   Very soft systolic murmur.  Pulmonary/Chest: Breath sounds normal. No respiratory distress. She has no wheezes. She has no rales. She exhibits no tenderness.  Abdominal: Soft. She exhibits no distension. There is no tenderness. There is no rebound and no guarding.  Musculoskeletal: She exhibits no edema.  Neurological: She is alert and oriented to person, place, and time.  Psychiatric: She has a normal mood and affect.  Vitals reviewed.   Lab Results  Component Value Date   WBC 8.6 09/22/2013   HGB 13.8 09/22/2013   HCT 41.1 09/22/2013   PLT 229.0 09/22/2013   GLUCOSE 94 09/22/2013   CHOL 182 09/22/2013   TRIG 101.0 09/22/2013   HDL 33.10* 09/22/2013   LDLCALC 129* 09/22/2013   ALT 30 09/22/2013   AST 24 09/22/2013   NA 138 09/22/2013   K 4.1 09/22/2013   CL 108 09/22/2013   CREATININE 0.7 09/22/2013   BUN 8 09/22/2013   CO2 25 09/22/2013   TSH 2.86 09/22/2013   HGBA1C 5.9 09/22/2013   MICROALBUR 0.3 09/22/2013    Assessment &  Plan:   Problem List Items Addressed This Visit    Atypical chest pain - Primary    New problem.  DDx: MSK,Ischemia GERD, PE. EKG ordered and obtained today.  I personally reviewed and interpreted the EKG. Interpretation: Normal sinus rhythm at a rate of 70. Normal intervals. No ST or T wave changes suggestive of ischemia. Normal EKG. Ischemia/heart disease unlikely in the setting of normal EKG and prior normal echo.  Patient denies any symptoms of reflux/GERD although she could have silent reflux which could be contributing.  Patient is concerned about potential PE as she is a smoker. Wells score 0 making this unlikely. Will obtain d-dimer to rule out PE. I advised the patient that this  does not appear to be ischemic. Will call patient with results of d-dimer. Obtaining CBC and metabolic panel as well.  Advised use of Prilosec as there could possibly a component of reflux. I suspect that this is more likely MSK in nature.       Relevant Orders   EKG 12-Lead (Completed)   D-dimer, quantitative (not at [facility])   CBC   Basic metabolic panel       Follow-up: PRN   Everlene Other, DO

## 2014-12-28 NOTE — Telephone Encounter (Signed)
Dr. Adriana Simas called pat with her Lab results from [facility]. The results were negative.

## 2014-12-28 NOTE — Progress Notes (Signed)
Pre visit review using our clinic review tool, if applicable. No additional management support is needed unless otherwise documented below in the visit note. 

## 2014-12-28 NOTE — Patient Instructions (Signed)
It was nice to see you today.  This is likely MSK in nature.  Your EKG is normal.   We will be in touch ASAP regarding your D-dimer and labs.  Go ahead and take some Prilosec in case there is a component of silent reflux.  Tylenol as needed.  Take care  Dr. Adriana Simas

## 2015-01-05 ENCOUNTER — Encounter: Payer: Self-pay | Admitting: Obstetrics and Gynecology

## 2015-01-26 ENCOUNTER — Ambulatory Visit (INDEPENDENT_AMBULATORY_CARE_PROVIDER_SITE_OTHER): Payer: Managed Care, Other (non HMO) | Admitting: Obstetrics and Gynecology

## 2015-01-26 ENCOUNTER — Encounter: Payer: Self-pay | Admitting: Obstetrics and Gynecology

## 2015-01-26 VITALS — BP 127/88 | HR 78 | Ht 66.0 in | Wt 209.2 lb

## 2015-01-26 DIAGNOSIS — E669 Obesity, unspecified: Secondary | ICD-10-CM | POA: Diagnosis not present

## 2015-01-26 MED ORDER — CYANOCOBALAMIN 1000 MCG/ML IJ SOLN: 1000.0000 ug | Freq: Once | INTRAMUSCULAR | Status: AC

## 2015-01-26 MED ORDER — PHENTERMINE HCL 37.5 MG PO TABS: 37.5000 mg | ORAL_TABLET | Freq: Every day | ORAL | Status: DC

## 2015-01-26 NOTE — Progress Notes (Signed)
Patient ID: Kristen Carey, female   DOB: 1981/02/22, 34 y.o.   MRN: [id number] Subjective:  Kristen Carey is a 34 y.o. No obstetric history on file. at Unknown being seen today for weight loss management- initial visit.  Patient reports General ROS: negative and reports previous weight loss attempts: Management changes made at the last visit include: exercise Onset was sudden/gradua,  months/year(s) ago.  Onset followed:  starting medication, change in living environment, change in affect, recent pregnancy, and mental status changes. Associated symptoms include: fatigue, depression, anxiety, greasy stools, abdominal pain, polydipsia, headaches, change in clothing fit and menstrual changes. Past evaluation has included: metabolic profile, hemoglobin A1c, thyroid panel Past treatment has included: small frequent feedings,  exercise management   The following portions of the patient's history were reviewed and updated as appropriate: allergies, current medications, past family history, past medical history, past social history, past surgical history and problem list.   Objective:   Filed Vitals:   01/26/15 0828  BP: 127/88  Pulse: 78  Height:  (1.676 m)  Weight: 209 lb 3.2 oz (94.892 kg)    General:  Alert, oriented and cooperative. Patient is in no acute distress.  :   :   :   :   :   :   PE: Well groomed female in no current distress,   Mental Status: Normal mood and affect. Normal behavior. Normal judgment and thought content.   Current BMI: Body mass index is 33.78 kg/(m^2).   Assessment and Plan:  Obesity  There are no diagnoses linked to this encounter.  Plan: low carb, High protein diet RX for adipex 37.5 mg daily and B12 .ml monthly, to start now with first injection given at today's visit. Reviewed side-effects common to both medications and expected outcomes. Increase daily water intake to at least 8 bottle a day, every day.  Goal is to reduse  weight by 10% by end of three months, and will re-evaluate then.  RTC in 4 weeks for Nurse visit to check weight & BP, and get next B12 injections.    Please refer to After Visit Summary for other counseling recommendations.    Virgilio Broadhead Elissa Lovett, CNM   Lanae Federer Elissa Lovett, CNM      Consider the Low Glycemic Index Diet and 6 smaller meals daily .  This boosts your metabolism and regulates your sugars:   Use the protein bar by [employer]kins because they have lots of fiber in them  Find the low carb flatbreads, tortillas and pita breads for sandwiches:  Joseph's makes a pita bread and a flat bread , available at [facility] and [employer]; Toufayah makes a low carb flatbread available at [employer] and HT that is 9 net carbs and 100 cal [place] makes a low carb whole wheat tortilla available at [employer] most grocery stores with 6 net carbs and 210 cal  [place] yogurt can still have a lot of carbs .  Dannon Light N fit has 80 cal and 8 carbs

## 2015-02-23 ENCOUNTER — Ambulatory Visit: Payer: Managed Care, Other (non HMO)

## 2015-07-28 ENCOUNTER — Encounter: Payer: Self-pay | Admitting: Internal Medicine

## 2015-07-28 ENCOUNTER — Ambulatory Visit (INDEPENDENT_AMBULATORY_CARE_PROVIDER_SITE_OTHER): Payer: Managed Care, Other (non HMO) | Admitting: Internal Medicine

## 2015-07-28 VITALS — BP 132/82 | HR 76 | Temp 98.4°F | Ht 65.75 in | Wt 222.5 lb

## 2015-07-28 DIAGNOSIS — L0390 Cellulitis, unspecified: Secondary | ICD-10-CM | POA: Diagnosis not present

## 2015-07-28 MED ORDER — GENTAMICIN SULFATE 0.1 % EX OINT: 1.0000 "application " | TOPICAL_OINTMENT | Freq: Three times a day (TID) | CUTANEOUS | Status: DC

## 2015-07-28 MED ORDER — DOXYCYCLINE HYCLATE 100 MG PO TABS: 100.0000 mg | ORAL_TABLET | Freq: Two times a day (BID) | ORAL | Status: DC

## 2015-07-28 NOTE — Progress Notes (Signed)
Subjective:    Patient ID: Kristen Carey, female    DOB: 08-21-1980, 35 y.o.   MRN: [id number]  HPI  35YO female presents for acute visit.  Notes pustule right upper medial thigh. Was able to express some fluid out of it and has been applying Bactroban with some improvement. No fever, chills. Worried about worsening infection given h/o infection in the past.   Wt Readings from Last 3 Encounters:  07/28/15 222 lb 8 oz (100.925 kg)  01/26/15 209 lb 3.2 oz (94.892 kg)  12/28/14 208 lb 8 oz (94.575 kg)   BP Readings from Last 3 Encounters:  07/28/15 132/82  01/26/15 127/88  12/28/14 114/82    Past Medical History  Diagnosis Date  . Migraines    Family History  Problem Relation Age of Onset  . Diabetes Mother   . Hypertension Mother   . Hyperlipidemia Mother   . Cancer Maternal Grandmother     lung  . Diabetes Maternal Grandmother   . Heart disease Maternal Grandmother   . Diabetes Maternal Grandfather   . Heart disease Maternal Grandfather   . Alcohol abuse Maternal Grandfather   . Stroke Paternal Grandfather    Past Surgical History  Procedure Laterality Date  . Tonsillectomy and adenoidectomy  1991  . Cesarean section  1999   Social History   Social History  . Marital Status: Single    Spouse Name: N/A  . Number of Children: N/A  . Years of Education: N/A   Social History Main Topics  . Smoking status: Current Every Day Smoker -- 1.00 packs/day  . Smokeless tobacco: Never Used  . Alcohol Use: 0.0 oz/week    0 Standard drinks or equivalent per week  . Drug Use: No  . Sexual Activity: Yes    Birth Control/ Protection: IUD   Other Topics Concern  . None   Social History Narrative   [place], with children 14YO and 10YO and boyfriend. 2 dogs.      Work - [employer]    Review of Systems  Constitutional: Negative for fever, chills, appetite change, fatigue and unexpected weight change.  Eyes: Negative for visual disturbance.    Respiratory: Negative for shortness of breath.   Cardiovascular: Negative for chest pain and leg swelling.  Gastrointestinal: Negative for abdominal pain.  Skin: Positive for color change and wound. Negative for rash.  Hematological: Negative for adenopathy. Does not bruise/bleed easily.  Psychiatric/Behavioral: Negative for dysphoric mood. The patient is not nervous/anxious.        Objective:    BP 132/82 mmHg  Pulse 76  Temp(Src) 98.4 F (36.9 C) (Oral)  Ht 5' 5.75" (1.67 m)  Wt 222 lb 8 oz (100.925 kg)  BMI 36.19 kg/m2  SpO2 98% Physical Exam  Constitutional: She is oriented to person, place, and time. She appears well-developed and well-nourished. No distress.  HENT:  Head: Normocephalic and atraumatic.  Right Ear: External ear normal.  Left Ear: External ear normal.  Nose: Nose normal.  Mouth/Throat: Oropharynx is clear and moist.  Eyes: Conjunctivae are normal. Pupils are equal, round, and reactive to light. Right eye exhibits no discharge. Left eye exhibits no discharge. No scleral icterus.  Neck: Normal range of motion. Neck supple. No tracheal deviation present. No thyromegaly present.  Cardiovascular: Normal rate, regular rhythm, normal heart sounds and intact distal pulses.  Exam reveals no gallop and no friction rub.   No murmur heard. Pulmonary/Chest: Effort normal and breath sounds normal. No  respiratory distress. She has no wheezes. She has no rales. She exhibits no tenderness.  Musculoskeletal: Normal range of motion. She exhibits no edema or tenderness.  Lymphadenopathy:    She has no cervical adenopathy.  Neurological: She is alert and oriented to person, place, and time. No cranial nerve deficit. She exhibits normal muscle tone. Coordination normal.  Skin: Skin is warm and dry. Rash noted. Rash is pustular. She is not diaphoretic. There is erythema. No pallor.     Psychiatric: She has a normal mood and affect. Her behavior is normal. Judgment and thought  content normal.          Assessment & Plan:   Problem List Items Addressed This Visit      Unprioritized   Cellulitis - Primary    Rash c/w folliculitis with one larger pustule. Will start topical gentamycin and oral doxycycline. Discussed risks of these medications. Follow up if symptoms are not improving.      Relevant Medications   gentamicin ointment (GARAMYCIN) 0.1 %   doxycycline (VIBRA-TABS) 100 MG tablet       Return if symptoms worsen or fail to improve.  Ronna Polio, MD Internal Medicine [facility]

## 2015-07-28 NOTE — Progress Notes (Signed)
Pre visit review using our clinic review tool, if applicable. No additional management support is needed unless otherwise documented below in the visit note. 

## 2015-07-28 NOTE — Assessment & Plan Note (Signed)
Rash c/w folliculitis with one larger pustule. Will start topical gentamycin and oral doxycycline. Discussed risks of these medications. Follow up if symptoms are not improving.

## 2015-07-28 NOTE — Patient Instructions (Signed)
Start topical gentamicin and oral Doxycycline.  Follow up if symptoms not improving.

## 2015-09-28 ENCOUNTER — Ambulatory Visit (INDEPENDENT_AMBULATORY_CARE_PROVIDER_SITE_OTHER): Payer: Managed Care, Other (non HMO) | Admitting: Internal Medicine

## 2015-09-28 ENCOUNTER — Encounter: Payer: Self-pay | Admitting: Internal Medicine

## 2015-09-28 VITALS — BP 126/82 | HR 89 | Temp 98.1°F | Ht 67.5 in

## 2015-09-28 DIAGNOSIS — R41840 Attention and concentration deficit: Secondary | ICD-10-CM

## 2015-09-28 DIAGNOSIS — R5382 Chronic fatigue, unspecified: Secondary | ICD-10-CM | POA: Diagnosis not present

## 2015-09-28 LAB — HEMOGLOBIN A1C: Hgb A1c MFr Bld: 5.8 % (ref 4.6–6.5)

## 2015-09-28 LAB — COMPREHENSIVE METABOLIC PANEL
ALT: 21 U/L (ref 0–35)
AST: 21 U/L (ref 0–37)
Albumin: 4.2 g/dL (ref 3.5–5.2)
Alkaline Phosphatase: 80 U/L (ref 39–117)
BILIRUBIN TOTAL: 0.5 mg/dL (ref 0.2–1.2)
BUN: 10 mg/dL (ref 6–23)
CALCIUM: 9.6 mg/dL (ref 8.4–10.5)
CO2: 24 meq/L (ref 19–32)
CREATININE: 0.65 mg/dL (ref 0.40–1.20)
Chloride: 102 mEq/L (ref 96–112)
GFR: 110.1 mL/min (ref >60.00)
GLUCOSE: 105 mg/dL — AB (ref 70–99)
Potassium: 4.3 mEq/L (ref 3.5–5.1)
SODIUM: 135 meq/L (ref 135–145)
Total Protein: 7.1 g/dL (ref 6.0–8.3)

## 2015-09-28 LAB — CBC WITH DIFFERENTIAL/PLATELET
BASOS PCT: 0.6 % (ref 0.0–3.0)
Basophils Absolute: 0 10*3/uL (ref 0.0–0.1)
EOS ABS: 0.2 10*3/uL (ref 0.0–0.7)
Eosinophils Relative: 2.1 % (ref 0.0–5.0)
HCT: 40.5 % (ref 36.0–46.0)
Hemoglobin: 13.8 g/dL (ref 12.0–15.0)
LYMPHS ABS: 2.4 10*3/uL (ref 0.7–4.0)
Lymphocytes Relative: 32.9 % (ref 12.0–46.0)
MCHC: 34.1 g/dL (ref 30.0–36.0)
MCV: 91.8 fl (ref 78.0–100.0)
MONO ABS: 0.7 10*3/uL (ref 0.1–1.0)
Monocytes Relative: 8.9 % (ref 3.0–12.0)
NEUTROS ABS: 4.1 10*3/uL (ref 1.4–7.7)
NEUTROS PCT: 55.5 % (ref 43.0–77.0)
PLATELETS: 254 10*3/uL (ref 150.0–400.0)
RBC: 4.42 Mil/uL (ref 3.87–5.11)
RDW: 12.9 % (ref 11.5–15.5)
WBC: 7.4 10*3/uL (ref 4.0–10.5)

## 2015-09-28 LAB — VITAMIN D 25 HYDROXY (VIT D DEFICIENCY, FRACTURES): VITD: 23.74 ng/mL — AB (ref 30.00–100.00)

## 2015-09-28 LAB — TSH: TSH: 2.53 u[IU]/mL (ref 0.35–4.50)

## 2015-09-28 LAB — VITAMIN B12: Vitamin B-12: 448 pg/mL (ref 211–911)

## 2015-09-28 NOTE — Assessment & Plan Note (Signed)
Recent worsening fatigue. Exam normal. Will check labs including TSH, CBC, B12, Vit D, and CMP. Discussed testing for sleep apnea and will look into cost. Discussed that hectic work schedule with 2 jobs may be playing a role. Follow up in 4 weeks.

## 2015-09-28 NOTE — Assessment & Plan Note (Signed)
Previously treated for ADD, however never tested for this. Recent concentration issues. Will set up testing for ADD with Dr. Reggy EyeAltabet.

## 2015-09-28 NOTE — Patient Instructions (Signed)
Labs today.  We will set up evaluation with Dr. Reggy EyeAltabet for testing for ADD.

## 2015-09-28 NOTE — Progress Notes (Signed)
Pre visit review using our clinic review tool, if applicable. No additional management support is needed unless otherwise documented below in the visit note. 

## 2015-09-28 NOTE — Progress Notes (Signed)
Subjective:    Patient ID: Kristen Carey, female    DOB: [birth year], 35 y.o.   MRN: [id number]  HPI  35YO female presents for acute visit.  Feeling very fatigued. Doing B12 injections monthly with no improvement. 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 However, even on weekends feels exhausted. Feels "foggy" brained. No depression or sadness. Sleeps well. Typically goes to bed at 10pm and wakes at 7:30am. No noted snoring or apnea noted. Treated in past for ADD with Vyvanse and felt better with this.  Wt Readings from Last 3 Encounters:  07/28/15 222 lb 8 oz (100.925 kg)  01/26/15 209 lb 3.2 oz (94.892 kg)  12/28/14 208 lb 8 oz (94.575 kg)   BP Readings from Last 3 Encounters:  09/28/15 126/82  07/28/15 132/82  01/26/15 127/88    Past Medical History  Diagnosis Date  . Migraines    Family History  Problem Relation Age of Onset  . Diabetes Mother   . Hypertension Mother   . Hyperlipidemia Mother   . Cancer Maternal Grandmother     lung  . Diabetes Maternal Grandmother   . Heart disease Maternal Grandmother   . Diabetes Maternal Grandfather   . Heart disease Maternal Grandfather   . Alcohol abuse Maternal Grandfather   . Stroke Paternal Grandfather    Past Surgical History  Procedure Laterality Date  . Tonsillectomy and adenoidectomy  1991  . Cesarean section  1999   Social History   Social History  . Marital Status: Single    Spouse Name: N/A  . Number of Children: N/A  . Years of Education: N/A   Social History Main Topics  . Smoking status: Current Every Day Smoker -- 1.00 packs/day  . Smokeless tobacco: Never Used  . Alcohol Use: 0.0 oz/week    0 Standard drinks or equivalent per week  . Drug Use: No  . Sexual Activity: Yes    Birth Control/ Protection: IUD   Other Topics Concern  . None   Social History Narrative   [place]ives in [place], with children 14YO and 10YO and boyfriend. 2 dogs.      Work - [employer]     Review of Systems  Constitutional: Positive for fatigue. Negative for fever, chills, appetite change and unexpected weight change.  Eyes: Negative for visual disturbance.  Respiratory: Negative for cough and shortness of breath.   Cardiovascular: Negative for chest pain, palpitations and leg swelling.  Gastrointestinal: Negative for nausea, vomiting, abdominal pain, diarrhea and constipation.  Skin: Negative for color change and rash.  Neurological: Negative for weakness.  Hematological: Negative for adenopathy. Does not bruise/bleed easily.  Psychiatric/Behavioral: Negative for suicidal ideas, sleep disturbance and dysphoric mood. The patient is not nervous/anxious.        Objective:    BP 126/82 mmHg  Pulse 89  Temp(Src) 98.1 F (36.7 C) (Oral)  Ht 5' 7.5" (1.715 m)  SpO2 98% Physical Exam  Constitutional: She is oriented to person, place, and time. She appears well-developed and well-nourished. No distress.  HENT:  Head: Normocephalic and atraumatic.  Right Ear: External ear normal.  Left Ear: External ear normal.  Nose: Nose normal.  Mouth/Throat: Oropharynx is clear and moist. No oropharyngeal exudate.  Eyes: Conjunctivae are normal. Pupils are equal, round, and reactive to light. Right eye exhibits no discharge. Left eye exhibits no discharge. No scleral icterus.  Neck: Normal range of motion. Neck supple. No tracheal deviation present. No thyromegaly present.  Cardiovascular: Normal rate, regular rhythm, normal heart sounds and intact distal pulses.  Exam reveals no gallop and no friction rub.   No murmur heard. Pulmonary/Chest: Effort normal and breath sounds normal. No accessory muscle usage. No tachypnea. No respiratory distress. She has no decreased breath sounds. She has no wheezes. She has no rhonchi. She has no rales. She exhibits no tenderness.  Musculoskeletal: Normal range of motion. She exhibits no edema or tenderness.  Lymphadenopathy:    She has no  cervical adenopathy.  Neurological: She is alert and oriented to person, place, and time. No cranial nerve deficit. She exhibits normal muscle tone. Coordination normal.  Skin: Skin is warm and dry. No rash noted. She is not diaphoretic. No erythema. No pallor.  Psychiatric: She has a normal mood and affect. Her behavior is normal. Judgment and thought content normal.          Assessment & Plan:   Problem List Items Addressed This Visit      Unprioritized   Chronic fatigue - Primary    Recent worsening fatigue. Exam normal. Will check labs including TSH, CBC, B12, Vit D, and CMP. Discussed testing for sleep apnea and will look into cost. Discussed that hectic work schedule with 2 jobs may be playing a role. Follow up in 4 weeks.      Relevant Orders   Comprehensive metabolic panel   Hemoglobin A1c   CBC with Differential/Platelet   TSH   VITAMIN D 25 Hydroxy (Vit-D Deficiency, Fractures)   B12   Concentration deficit    Previously treated for ADD, however never tested for this. Recent concentration issues. Will set up testing for ADD with Dr. Reggy Eye.      Relevant Orders   Ambulatory referral to Psychology       Return in about 4 weeks (around 10/26/2015) for Recheck.  Ronna Polio, MD Internal Medicine [facility]

## 2016-03-04 ENCOUNTER — Encounter: Payer: Self-pay | Admitting: Family Medicine

## 2016-03-04 ENCOUNTER — Telehealth: Payer: Self-pay | Admitting: Internal Medicine

## 2016-03-04 ENCOUNTER — Ambulatory Visit (INDEPENDENT_AMBULATORY_CARE_PROVIDER_SITE_OTHER): Payer: 59 | Admitting: Family Medicine

## 2016-03-04 VITALS — BP 126/88 | HR 90 | Temp 98.8°F | Wt 219.4 lb

## 2016-03-04 DIAGNOSIS — I4949 Other premature depolarization: Secondary | ICD-10-CM | POA: Diagnosis not present

## 2016-03-04 NOTE — Telephone Encounter (Signed)
Your 1030 today, thanks

## 2016-03-04 NOTE — Telephone Encounter (Signed)
appt scheduled based off [employer] [facility] note for 1030 am today with Dr. Adriana Simasook

## 2016-03-04 NOTE — Progress Notes (Signed)
Subjective:  Patient ID: Kristen Carey, female    DOB: 18-Apr-1981  Age: 35 y.o. MRN: [id number]  CC: Heart skipping beats/double beating  HPI:  35 year old female presents with the above complaint.  Patient reports that for the past few months she's had feelings that her heart is skipping beats or double beating. It is been occurring more frequently as of late and was particularly worse this past Saturday. No associated pain. No shortness of breath. No reports of palpitations or tachycardia. No known inciting factor. No known exacerbating or relieving factors. No other associated symptoms. No other complaints at this time  Social Hx   Social History   Social History  . Marital status: Single    Spouse name: N/A  . Number of children: N/A  . Years of education: N/A   Social History Main Topics  . Smoking status: Current Every Day Smoker    Packs/day: 1.00  . Smokeless tobacco: Never Used  . Alcohol use 0.0 oz/week  . Drug use: No  . Sexual activity: Yes    Birth control/ protection: IUD   Other Topics Concern  . None   Social History Narrative   Lives in [place], with children 14YO and 10YO and boyfriend. 2 dogs.      Work - [employer]   Review of Systems  Constitutional: Negative.   Cardiovascular: Negative for chest pain.       "Heart skipping".   Objective:  BP 126/88 (BP Location: Right Arm, Patient Position: Sitting, Cuff Size: Large)   Pulse 90   Temp 98.8 F (37.1 C) (Oral)   Wt 219 lb 6 oz (99.5 kg)   SpO2 97%   BMI 33.85 kg/m   BP/Weight 03/04/2016 09/28/2015 07/28/2015  Systolic BP 126 126 132  Diastolic BP 88 82 82  Wt. (Lbs) 219.38 - 222.5  BMI 33.85 - 36.19   Physical Exam  Constitutional: She is oriented to person, place, and time. She appears well-developed. No distress.  Cardiovascular: Normal rate and regular rhythm.   Pulmonary/Chest: Effort normal. She has no wheezes. She has no rales.  Neurological: She is alert and oriented  to person, place, and time.  Psychiatric: She has a normal mood and affect.  Vitals reviewed.  Lab Results  Component Value Date   WBC 7.4 09/28/2015   HGB 13.8 09/28/2015   HCT 40.5 09/28/2015   PLT 254.0 09/28/2015   GLUCOSE 105 (H) 09/28/2015   CHOL 182 09/22/2013   TRIG 101.0 09/22/2013   HDL 33.10 (L) 09/22/2013   LDLCALC 129 (H) 09/22/2013   ALT 21 09/28/2015   AST 21 09/28/2015   NA 135 09/28/2015   K 4.3 09/28/2015   CL 102 09/28/2015   CREATININE 0.65 09/28/2015   BUN 10 09/28/2015   CO2 24 09/28/2015   TSH 2.53 09/28/2015   HGBA1C 5.8 09/28/2015   MICROALBUR 0.3 09/22/2013    Assessment & Plan:   Problem List Items Addressed This Visit    Premature contractions, cardiac - Primary    New problem. History consistent with PAC's/PVC's. EKG obtained today. Interpretation - normal sinus rhythm with a rate of 80. Normal axis. No ST or T-wave changes. Normal EKG. Reassurance provided. Discussed further workup as well as potential use of medication. Patient elected to proceed with Holter monitor. Will arrange.      Relevant Orders   EKG 12-Lead   Holter monitor - 48 hour    Other Visit Diagnoses   None.  Follow-up: PRN  [place]

## 2016-03-04 NOTE — Patient Instructions (Signed)
We will call regarding the holter.  Take care  Dr. Adriana Simasook

## 2016-03-04 NOTE — Telephone Encounter (Signed)
Pt called and stated that she is having some heart palpitations, and would like to scheduled an appointment. Sent pt's call to [facility] triage.

## 2016-03-04 NOTE — Telephone Encounter (Signed)
Patient Name: Kristen Carey  DOB: [birth year]    Initial Comment Caller says, heart palpitations, wants an appt for tomorrow    Nurse Assessment  Nurse: Stefano GaulStringer, RN, Dwana CurdVera Date/Time (Eastern Time): 03/04/2016 9:13:22 AM  Confirm and document reason for call. If symptomatic, describe symptoms. You must click the next button to save text entered. ---Caller states she had heart palpitations on Saturday. Has not had heart palpitations since Saturday. Had chest tightness on Saturday but not now. No SOB.  Has the patient traveled out of the country within the last 30 days? ---Not Applicable  Does the patient have any new or worsening symptoms? ---Yes  Will a triage be completed? ---Yes  Related visit to physician within the last 2 weeks? ---No  Does the PT have any chronic conditions? (i.e. diabetes, asthma, etc.) ---No  Is the patient pregnant or possibly pregnant? (Ask all females between the ages of 4612-55) ---No  Is this a behavioral health or substance abuse call? ---No     Guidelines    Guideline Title Affirmed Question Affirmed Notes  Heart Rate and Heartbeat Questions [1] Skipped or extra beat(s) AND [2] occurs 4 or more times per minute    Final Disposition User   See Physician within 4 Hours (or PCP triage) Stefano GaulStringer, RN, Vera    Comments  appt scheduled for 03/04/2016 at 10:30 am with Dr. Everlene OtherJayce Cook   Referrals  REFERRED TO PCP OFFICE   Disagree/Comply: Comply

## 2016-03-04 NOTE — Assessment & Plan Note (Signed)
New problem. History consistent with PAC's/PVC's. EKG obtained today. Interpretation - normal sinus rhythm with a rate of 80. Normal axis. No ST or T-wave changes. Normal EKG. Reassurance provided. Discussed further workup as well as potential use of medication. Patient elected to proceed with Holter monitor. Will arrange.

## 2016-03-19 ENCOUNTER — Ambulatory Visit (INDEPENDENT_AMBULATORY_CARE_PROVIDER_SITE_OTHER): Payer: 59

## 2016-03-19 DIAGNOSIS — I4949 Other premature depolarization: Secondary | ICD-10-CM

## 2016-07-24 ENCOUNTER — Encounter: Payer: Self-pay | Admitting: Family Medicine

## 2016-07-24 ENCOUNTER — Ambulatory Visit (INDEPENDENT_AMBULATORY_CARE_PROVIDER_SITE_OTHER): Payer: 59 | Admitting: Family Medicine

## 2016-07-24 VITALS — BP 152/89 | HR 70 | Temp 98.5°F | Wt 224.6 lb

## 2016-07-24 DIAGNOSIS — R1013 Epigastric pain: Secondary | ICD-10-CM

## 2016-07-24 LAB — COMPREHENSIVE METABOLIC PANEL
ALK PHOS: 96 U/L (ref 39–117)
ALT: 18 U/L (ref 0–35)
AST: 16 U/L (ref 0–37)
Albumin: 4 g/dL (ref 3.5–5.2)
BILIRUBIN TOTAL: 0.3 mg/dL (ref 0.2–1.2)
BUN: 7 mg/dL (ref 6–23)
CO2: 29 mEq/L (ref 19–32)
Calcium: 9.4 mg/dL (ref 8.4–10.5)
Chloride: 105 mEq/L (ref 96–112)
Creatinine, Ser: 0.67 mg/dL (ref 0.40–1.20)
GFR: 105.82 mL/min (ref >60.00)
Glucose, Bld: 92 mg/dL (ref 70–99)
Potassium: 4 mEq/L (ref 3.5–5.1)
SODIUM: 138 meq/L (ref 135–145)
TOTAL PROTEIN: 7.1 g/dL (ref 6.0–8.3)

## 2016-07-24 LAB — CBC
HCT: 42 % (ref 36.0–46.0)
Hemoglobin: 14.2 g/dL (ref 12.0–15.0)
MCHC: 33.7 g/dL (ref 30.0–36.0)
MCV: 94.5 fl (ref 78.0–100.0)
Platelets: 250 10*3/uL (ref 150.0–400.0)
RBC: 4.45 Mil/uL (ref 3.87–5.11)
RDW: 12.9 % (ref 11.5–15.5)
WBC: 8.2 10*3/uL (ref 4.0–10.5)

## 2016-07-24 MED ORDER — DEXLANSOPRAZOLE 60 MG PO CPDR: 60.0000 mg | DELAYED_RELEASE_CAPSULE | Freq: Every day | ORAL | 0 refills | Status: DC

## 2016-07-24 NOTE — Progress Notes (Signed)
Subjective:  Patient ID: Kristen Carey, female    DOB: 11-25-1980  Age: 36 y.o. MRN: [id number]  CC: Abdominal pain  HPI:  36 year old female presents with complaints of abdominal pain.  Patient states that she has had epigastric abdominal pain the past week. Describes it as a fullness. She reports associated nausea. Pain is moderate in severity, 5/10. Better with pressure and lying down. No known exacerbating factors. She's having normal bowel movements. No vomiting. She states that it also seems to be better with some food intake. She has used Tums without improvement. No other associated symptoms. No other complaints or concerns at this time.  Social Hx   Social History   Social History  . Marital status: Single    Spouse name: N/A  . Number of children: N/A  . Years of education: N/A   Social History Main Topics  . Smoking status: Current Every Day Smoker    Packs/day: 1.00  . Smokeless tobacco: Never Used  . Alcohol use 0.0 oz/week  . Drug use: No  . Sexual activity: Yes    Birth control/ protection: IUD   Other Topics Concern  . None   Social History Narrative   Lives in [place], with children 14YO and 10YO and boyfriend. 2 dogs.      Work - [employer]    Review of Systems  Constitutional: Negative.   Gastrointestinal: Positive for abdominal pain and nausea.   Objective:  BP (!) 152/89   Pulse 70   Temp 98.5 F (36.9 C) (Oral)   Wt 224 lb 9.6 oz (101.9 kg)   SpO2 99%   BMI 34.66 kg/m   BP/Weight 07/24/2016 03/04/2016 09/28/2015  Systolic BP 152 126 126  Diastolic BP 89 88 82  Wt. (Lbs) 224.6 219.38 -  BMI 34.66 33.85 -   Physical Exam  Constitutional: She is oriented to person, place, and time. She appears well-developed. No distress.  Cardiovascular: Normal rate and regular rhythm.   Pulmonary/Chest: Effort normal and breath sounds normal.  Abdominal: Soft. She exhibits no distension.  Mildly tender to palpation in the epigastric region.   Neurological: She is alert and oriented to person, place, and time.  Psychiatric: She has a normal mood and affect.  Vitals reviewed.  Lab Results  Component Value Date   WBC 7.4 09/28/2015   HGB 13.8 09/28/2015   HCT 40.5 09/28/2015   PLT 254.0 09/28/2015   GLUCOSE 105 (H) 09/28/2015   CHOL 182 09/22/2013   TRIG 101.0 09/22/2013   HDL 33.10 (L) 09/22/2013   LDLCALC 129 (H) 09/22/2013   ALT 21 09/28/2015   AST 21 09/28/2015   NA 135 09/28/2015   K 4.3 09/28/2015   CL 102 09/28/2015   CREATININE 0.65 09/28/2015   BUN 10 09/28/2015   CO2 24 09/28/2015   TSH 2.53 09/28/2015   HGBA1C 5.8 09/28/2015   MICROALBUR 0.3 09/22/2013   Assessment & Plan:   Problem List Items Addressed This Visit    Epigastric abdominal pain - Primary    New acute problem. Suspect GERD versus gastritis versus peptic ulcer disease. Laboratory studies today. Treating empirically with Dexilant. Samples given.      Relevant Orders   CBC   Comprehensive metabolic panel     Meds ordered this encounter  Medications  . dexlansoprazole (DEXILANT) 60 MG capsule    Sig: Take 1 capsule (60 mg total) by mouth daily.    Dispense:  30 capsule  Refill:  0   Follow-up: PRN  [place]

## 2016-07-24 NOTE — Progress Notes (Signed)
Pre visit review using our clinic review tool, if applicable. No additional management support is needed unless otherwise documented below in the visit note. 

## 2016-07-24 NOTE — Patient Instructions (Signed)
1 tablet daily.  Call if you fail to improve or worsen.  Take care  Dr. Adriana Simasook

## 2016-07-24 NOTE — Assessment & Plan Note (Signed)
New acute problem. Suspect GERD versus gastritis versus peptic ulcer disease. Laboratory studies today. Treating empirically with Dexilant. Samples given.

## 2016-07-25 DIAGNOSIS — R001 Bradycardia, unspecified: Secondary | ICD-10-CM | POA: Diagnosis not present

## 2016-07-25 DIAGNOSIS — R1013 Epigastric pain: Secondary | ICD-10-CM | POA: Diagnosis not present

## 2016-07-25 DIAGNOSIS — K30 Functional dyspepsia: Secondary | ICD-10-CM | POA: Diagnosis not present

## 2016-07-25 DIAGNOSIS — R110 Nausea: Secondary | ICD-10-CM | POA: Diagnosis not present

## 2016-07-25 DIAGNOSIS — R079 Chest pain, unspecified: Secondary | ICD-10-CM | POA: Diagnosis not present

## 2016-07-25 DIAGNOSIS — R1010 Upper abdominal pain, unspecified: Secondary | ICD-10-CM | POA: Diagnosis not present

## 2016-07-25 DIAGNOSIS — G43909 Migraine, unspecified, not intractable, without status migrainosus: Secondary | ICD-10-CM | POA: Diagnosis not present

## 2016-07-25 DIAGNOSIS — K5900 Constipation, unspecified: Secondary | ICD-10-CM | POA: Diagnosis not present

## 2017-05-28 DIAGNOSIS — R5383 Other fatigue: Secondary | ICD-10-CM | POA: Diagnosis not present

## 2017-05-28 DIAGNOSIS — E538 Deficiency of other specified B group vitamins: Secondary | ICD-10-CM | POA: Diagnosis not present

## 2017-05-28 DIAGNOSIS — Z01419 Encounter for gynecological examination (general) (routine) without abnormal findings: Secondary | ICD-10-CM | POA: Diagnosis not present

## 2017-07-22 DIAGNOSIS — Z719 Counseling, unspecified: Secondary | ICD-10-CM | POA: Diagnosis not present

## 2017-08-06 DIAGNOSIS — Z719 Counseling, unspecified: Secondary | ICD-10-CM | POA: Diagnosis not present

## 2017-08-18 DIAGNOSIS — Z719 Counseling, unspecified: Secondary | ICD-10-CM | POA: Diagnosis not present

## 2017-08-21 DIAGNOSIS — Z719 Counseling, unspecified: Secondary | ICD-10-CM | POA: Diagnosis not present

## 2017-09-01 DIAGNOSIS — Z719 Counseling, unspecified: Secondary | ICD-10-CM | POA: Diagnosis not present

## 2017-09-03 DIAGNOSIS — Z719 Counseling, unspecified: Secondary | ICD-10-CM | POA: Diagnosis not present

## 2017-09-03 DIAGNOSIS — H1032 Unspecified acute conjunctivitis, left eye: Secondary | ICD-10-CM | POA: Diagnosis not present

## 2017-09-10 DIAGNOSIS — Z719 Counseling, unspecified: Secondary | ICD-10-CM | POA: Diagnosis not present

## 2017-09-17 DIAGNOSIS — Z719 Counseling, unspecified: Secondary | ICD-10-CM | POA: Diagnosis not present

## 2017-09-24 DIAGNOSIS — Z719 Counseling, unspecified: Secondary | ICD-10-CM | POA: Diagnosis not present

## 2017-11-05 ENCOUNTER — Encounter: Payer: Self-pay | Admitting: Family Medicine

## 2017-11-05 ENCOUNTER — Ambulatory Visit: Payer: 59 | Admitting: Family Medicine

## 2017-11-05 DIAGNOSIS — R05 Cough: Secondary | ICD-10-CM | POA: Diagnosis not present

## 2017-11-05 DIAGNOSIS — R059 Cough, unspecified: Secondary | ICD-10-CM

## 2017-11-05 MED ORDER — PREDNISONE 10 MG PO TABS: ORAL_TABLET | ORAL | 0 refills | Status: DC

## 2017-11-05 MED ORDER — AZITHROMYCIN 250 MG PO TABS: ORAL_TABLET | ORAL | 0 refills | Status: DC

## 2017-11-05 NOTE — Progress Notes (Signed)
She is thinking about quitting smokding.  Smoking 1 PPD.  D/w pt about options, specifically the patch.  She can check with insurance about coverage.    Sick for about 2 weeks.  Post nasal gtt and ST initially.  Then more wheeze, noted in the upper chest.  Had been using albuterol.  Coughing fits.  sats 92-95% at home.  H/o pertussis years ago, that was treated.  No sputum.  No fevers.  SOB occ.  Tighter across the top of the chest.  No vomiting.  No diarrhea.  Never had to take prednisone.  Using SABA rarely at baseline, now since she has been sick ~2-3 times a day.    Presumed sick contacts at work.    Meds, vitals, and allergies reviewed.   ROS: Per HPI unless specifically indicated in ROS section   GEN: nad, alert and oriented HEENT: mucous membranes moist, tm w/o erythema, nasal exam w/o erythema, clear discharge noted,  OP with cobblestoning NECK: supple w/o LA CV: rrr.   PULM: scant esp wheeze but o/w ctab, no inc wob EXT: no edema SKIN: well perfused.

## 2017-11-05 NOTE — Patient Instructions (Signed)
Presumed bronchitis.   Start zithromax and take prednisone with food.  See if the prednisone with with the albuterol helps.  Update us as needed.  Rest and fluids.   Try the patch (21--->14--->7mg ) to help with smoking.  Take care.  Glad to see you.

## 2017-11-06 NOTE — Assessment & Plan Note (Signed)
Presumed bronchitis.   Start zithromax and take prednisone with food. Routine cautions d/w pt about both.   See if the prednisone with with the albuterol helps.  Update us as needed.  Rest and fluids.   Can try nicotine patch (21--->14--->7mg ) to help with smoking.

## 2018-01-22 ENCOUNTER — Ambulatory Visit: Payer: 59 | Admitting: Internal Medicine

## 2018-01-22 ENCOUNTER — Encounter: Payer: Self-pay | Admitting: Internal Medicine

## 2018-01-22 VITALS — BP 132/84 | HR 85 | Temp 98.6°F | Ht 65.75 in | Wt 233.8 lb

## 2018-01-22 DIAGNOSIS — Z720 Tobacco use: Secondary | ICD-10-CM

## 2018-01-22 DIAGNOSIS — Z13220 Encounter for screening for lipoid disorders: Secondary | ICD-10-CM

## 2018-01-22 DIAGNOSIS — Z0000 Encounter for general adult medical examination without abnormal findings: Secondary | ICD-10-CM

## 2018-01-22 DIAGNOSIS — Z1329 Encounter for screening for other suspected endocrine disorder: Secondary | ICD-10-CM | POA: Diagnosis not present

## 2018-01-22 DIAGNOSIS — Z1389 Encounter for screening for other disorder: Secondary | ICD-10-CM

## 2018-01-22 DIAGNOSIS — F419 Anxiety disorder, unspecified: Secondary | ICD-10-CM

## 2018-01-22 DIAGNOSIS — E559 Vitamin D deficiency, unspecified: Secondary | ICD-10-CM | POA: Insufficient documentation

## 2018-01-22 DIAGNOSIS — R5383 Other fatigue: Secondary | ICD-10-CM | POA: Diagnosis not present

## 2018-01-22 DIAGNOSIS — Z716 Tobacco abuse counseling: Secondary | ICD-10-CM

## 2018-01-22 DIAGNOSIS — R7303 Prediabetes: Secondary | ICD-10-CM

## 2018-01-22 MED ORDER — NICOTINE 10 MG IN INHA: 1.0000 | RESPIRATORY_TRACT | 1 refills | Status: DC | PRN

## 2018-01-22 MED ORDER — SERTRALINE HCL 25 MG PO TABS: 25.0000 mg | ORAL_TABLET | Freq: Every day | ORAL | 2 refills | Status: DC

## 2018-01-22 MED ORDER — BUPROPION HCL ER (SR) 150 MG PO TB12: ORAL_TABLET | ORAL | 2 refills | Status: DC

## 2018-01-22 NOTE — Progress Notes (Signed)
Pre visit review using our clinic review tool, if applicable. No additional management support is needed unless otherwise documented below in the visit note. 

## 2018-01-22 NOTE — Progress Notes (Signed)
Chief Complaint  Patient presents with  . Transitions Of Care   TOC  1. Chronic fatigue h/o low vitamin D not taking vitamin D 2. Anxiety and irritability GAD 7 score 10 today was on zoloft ? 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 No FH mood d/o 3. Tobacco abuse smoking 1 ppd x 20 years chantix caused bad dreams   Review of Systems  Constitutional: Positive for malaise/fatigue. Negative for weight loss.  HENT: Negative for hearing loss.   Eyes: Negative for blurred vision.  Respiratory: Negative for shortness of breath.   Cardiovascular: Negative for chest pain.  Gastrointestinal: Negative for abdominal pain.  Musculoskeletal: Negative for joint pain.  Skin: Negative for rash.  Neurological: Negative for headaches.  Psychiatric/Behavioral: Negative for depression. The patient is nervous/anxious.    Past Medical History:  Diagnosis Date  . Migraines    Past Surgical History:  Procedure Laterality Date  . CESAREAN SECTION  1999  . TONSILLECTOMY AND ADENOIDECTOMY  1991   Family History  Problem Relation Age of Onset  . Diabetes Mother   . Hypertension Mother   . Hyperlipidemia Mother   . Cancer Maternal Grandmother        lung  . Diabetes Maternal Grandmother   . Heart disease Maternal Grandmother   . Diabetes Maternal Grandfather   . Heart disease Maternal Grandfather   . Alcohol abuse Maternal Grandfather   . Stroke Paternal Grandfather    Social History   Socioeconomic History  . Marital status: Single    Spouse name: Not on file  . Number of children: Not on file  . Years of education: Not on file  . Highest education level: Not on file  Occupational History  . Not on file  Social Needs  . Financial resource strain: Not on file  . Food insecurity:    Worry: Not on file    Inability: Not on file  . Transportation needs:    Medical: Not on file    Non-medical: Not on file  Tobacco Use  . Smoking status: Current Every Day  Smoker    Packs/day: 1.00  . Smokeless tobacco: Never Used  Substance and Sexual Activity  . Alcohol use: Yes    Alcohol/week: 0.0 standard drinks  . Drug use: No  . Sexual activity: Yes    Birth control/protection: IUD  Lifestyle  . Physical activity:    Days per week: Not on file    Minutes per session: Not on file  . Stress: Not on file  Relationships  . Social connections:    Talks on phone: Not on file    Gets together: Not on file    Attends religious service: Not on file    Active member of club or organization: Not on file    Attends meetings of clubs or organizations: Not on file    Relationship status: Not on file  . Intimate partner violence:    Fear of current or ex partner: Not on file    Emotionally abused: Not on file    Physically abused: Not on file    Forced sexual activity: Not on file  Other Topics Concern  . Not on file  Social History Narrative   Lives in [place], with children 14YO and 10YO and boyfriend. 2 dogs.      Work - [employer]   Current Meds  Medication Sig  . cyanocobalamin (,VITAMIN B-12,) 1000 MCG/ML injection Inject 1 mL (1,000 mcg total) into  the muscle once.  Marland Kitchen levonorgestrel (MIRENA) 20 MCG/24HR IUD 1 each by Intrauterine route once.  . SUMAtriptan (IMITREX) 100 MG tablet Take 100 mg by mouth.   Allergies  Allergen Reactions  . Penicillins Anaphylaxis    Have taken zpak w/no problems  . Chantix [Varenicline]     Abnormal dreams.    No results found for this or any previous visit (from the past 2160 hour(s)). Objective  Body mass index is 38.02 kg/m. Wt Readings from Last 3 Encounters:  01/22/18 233 lb 12.8 oz (106.1 kg)  11/05/17 230 lb 4 oz (104.4 kg)  07/24/16 224 lb 9.6 oz (101.9 kg)   Temp Readings from Last 3 Encounters:  01/22/18 98.6 F (37 C) (Oral)  11/05/17 98.4 F (36.9 C) (Oral)  07/24/16 98.5 F (36.9 C) (Oral)   BP Readings from Last 3 Encounters:  01/22/18 132/84  11/05/17 120/78  07/24/16  (!) 152/89   Pulse Readings from Last 3 Encounters:  01/22/18 85  11/05/17 90  07/24/16 70    Physical Exam  Constitutional: She is oriented to person, place, and time. Vital signs are normal. She appears well-developed and well-nourished. She is cooperative.  HENT:  Head: Normocephalic and atraumatic.  Mouth/Throat: Oropharynx is clear and moist and mucous membranes are normal.  Eyes: Pupils are equal, round, and reactive to light. Conjunctivae are normal.  Cardiovascular: Normal rate, regular rhythm and normal heart sounds.  Pulmonary/Chest: Effort normal and breath sounds normal.  Neurological: She is alert and oriented to person, place, and time. Gait normal.  Skin: Skin is warm, dry and intact.  Psychiatric: She has a normal mood and affect. Her behavior is normal. Judgment and thought content normal. Cognition and memory are normal.  Nursing note and vitals reviewed.   Assessment   1. Chronic fatigue  2. Anxiety  3. Vit D def  4. Tobacco abuse  5. HM/prediabetes/obesity  Plan   1. sch fasting labs  Pt will contact Dr. Nena Jordan to set up at home sleep study to r/o OSA   2. zoloft 25 mg inc if needed GAD 7 score 10 today  3. Check fasting labs 4. 1 ppd rec cessation  wellbutrin  Nicotrol if does not help can try otc nicotine patches  5.  Will get flu at work  tdap utd  No MMR or hep B pt declines   sch fasting labs Has IUD f/u [facility] will need to get copy of pap in future  Of note h/o eczema f/u Dr. Phillip Heal   Provider: Dr. Olivia Mackie [facility]

## 2018-01-22 NOTE — Patient Instructions (Addendum)
5000 IU D3 daily vitamin D  Contact Dr. Delfin Edis about home sleep study   If insurance does not cover nicotrol could try nicotine patch taper 21>14>7 for up to 3 months over the counter    Fatigue Fatigue is feeling tired all of the time, a lack of energy, or a lack of motivation. Occasional or mild fatigue is often a normal response to activity or life in general. However, long-lasting (chronic) or extreme fatigue may indicate an underlying medical condition. Follow these instructions at home: Watch your fatigue for any changes. The following actions may help to lessen any discomfort you are feeling:  Talk to your health care provider about how much sleep you need each night. Try to get the required amount every night.  Take medicines only as directed by your health care provider.  Eat a healthy and nutritious diet. Ask your health care provider if you need help changing your diet.  Drink enough fluid to keep your urine clear or pale yellow.  Practice ways of relaxing, such as yoga, meditation, massage therapy, or acupuncture.  Exercise regularly.  Change situations that cause you stress. Try to keep your work and personal routine reasonable.  Do not abuse illegal drugs.  Limit alcohol intake to no more than 1 drink per day for nonpregnant women and 2 drinks per day for men. One drink equals 12 ounces of beer, 5 ounces of wine, or 1 ounces of hard liquor.  Take a multivitamin, if directed by your health care provider.  Contact a health care provider if:  Your fatigue does not get better.  You have a fever.  You have unintentional weight loss or gain.  You have headaches.  You have difficulty: ? Falling asleep. ? Sleeping throughout the night.  You feel angry, guilty, anxious, or sad.  You are unable to have a bowel movement (constipation).  You skin is dry.  Your legs or another part of your body is swollen. Get help right away if:  You feel confused.  Your  vision is blurry.  You feel faint or pass out.  You have a severe headache.  You have severe abdominal, pelvic, or back pain.  You have chest pain, shortness of breath, or an irregular or fast heartbeat.  You are unable to urinate or you urinate less than normal.  You develop abnormal bleeding, such as bleeding from the rectum, vagina, nose, lungs, or nipples.  You vomit blood.  You have thoughts about harming yourself or committing suicide.  You are worried that you might harm someone else. This information is not intended to replace advice given to you by your health care provider. Make sure you discuss any questions you have with your health care provider. Document Released: 03/03/2007 Document Revised: 10/12/2015 Document Reviewed: 09/07/2013 Elsevier Interactive Patient Education  2018 [employer].    Vitamin D Deficiency Vitamin D deficiency is when your body does not have enough vitamin D. Vitamin D is important to your body for many reasons:  It helps the body to absorb two important minerals, called calcium and phosphorus.  It plays a role in bone health.  It may help to prevent some diseases, such as diabetes and multiple sclerosis.  It plays a role in muscle function, including heart function.  You can get vitamin D by:  Eating foods that naturally contain vitamin D.  Eating or drinking milk or other dairy products that have vitamin D added to them.  Taking a vitamin D supplement or  a multivitamin supplement that contains vitamin D.  Being in the sun. Your body naturally makes vitamin D when your skin is exposed to sunlight. Your body changes the sunlight into a form of the vitamin that the body can use.  If vitamin D deficiency is severe, it can cause a condition in which your bones become soft. In adults, this condition is called osteomalacia. In children, this condition is called rickets. What are the causes? Vitamin D deficiency may be caused  by:  Not eating enough foods that contain vitamin D.  Not getting enough sun exposure.  Having certain digestive system diseases that make it difficult for your body to absorb vitamin D. These diseases include Crohn disease, chronic pancreatitis, and cystic fibrosis.  Having a surgery in which a part of the stomach or a part of the small intestine is removed.  Being obese.  Having chronic kidney disease or liver disease.  What increases the risk? This condition is more likely to develop in:  Older people.  People who do not spend much time outdoors.  People who live in a long-term care facility.  People who have had broken bones.  People with weak or thin bones (osteoporosis).  People who have a disease or condition that changes how the body absorbs vitamin D.  People who have dark skin.  People who take certain medicines, such as steroid medicines or certain seizure medicines.  People who are overweight or obese.  What are the signs or symptoms? In mild cases of vitamin D deficiency, there may not be any symptoms. If the condition is severe, symptoms may include:  Bone pain.  Muscle pain.  Falling often.  Broken bones caused by a minor injury.  How is this diagnosed? This condition is usually diagnosed with a blood test. How is this treated? Treatment for this condition may depend on what caused the condition. Treatment options include:  Taking vitamin D supplements.  Taking a calcium supplement. Your health care provider will suggest what dose is best for you.  Follow these instructions at home:  Take medicines and supplements only as told by your health care provider.  Eat foods that contain vitamin D. Choices include: ? Fortified dairy products, cereals, or juices. Fortified means that vitamin D has been added to the food. Check the label on the package to be sure. ? Fatty fish, such as salmon or trout. ? Eggs. ? Oysters.  Do not use a tanning  bed.  Maintain a healthy weight. Lose weight, if needed.  Keep all follow-up visits as told by your health care provider. This is important. Contact a health care provider if:  Your symptoms do not go away.  You feel like throwing up (nausea) or you throw up (vomit).  You have fewer bowel movements than usual or it is difficult for you to have a bowel movement (constipation). This information is not intended to replace advice given to you by your health care provider. Make sure you discuss any questions you have with your health care provider. Document Released: 07/29/2011 Document Revised: 10/18/2015 Document Reviewed: 09/21/2014 Elsevier Interactive Patient Education  2018 [employer].

## 2018-02-05 ENCOUNTER — Encounter: Payer: Self-pay | Admitting: Internal Medicine

## 2018-02-13 ENCOUNTER — Encounter: Payer: Self-pay | Admitting: Internal Medicine

## 2018-02-18 ENCOUNTER — Telehealth: Payer: Self-pay

## 2018-02-18 ENCOUNTER — Other Ambulatory Visit: Payer: Self-pay | Admitting: Internal Medicine

## 2018-02-18 DIAGNOSIS — Z720 Tobacco use: Secondary | ICD-10-CM

## 2018-02-18 DIAGNOSIS — F419 Anxiety disorder, unspecified: Secondary | ICD-10-CM

## 2018-02-18 DIAGNOSIS — Z716 Tobacco abuse counseling: Secondary | ICD-10-CM

## 2018-02-18 NOTE — Telephone Encounter (Signed)
Patient is calling to ask about a prior authorizations for nicotine. She was calling to see if it had been completed.

## 2018-02-18 NOTE — Telephone Encounter (Signed)
This medication has been rejected on PA.

## 2018-03-02 DIAGNOSIS — H524 Presbyopia: Secondary | ICD-10-CM | POA: Diagnosis not present

## 2018-03-09 ENCOUNTER — Other Ambulatory Visit: Payer: 59

## 2018-03-13 ENCOUNTER — Ambulatory Visit: Payer: 59 | Admitting: Internal Medicine

## 2018-06-09 DIAGNOSIS — Z6837 Body mass index (BMI) 37.0-37.9, adult: Secondary | ICD-10-CM | POA: Diagnosis not present

## 2018-06-09 DIAGNOSIS — Z01419 Encounter for gynecological examination (general) (routine) without abnormal findings: Secondary | ICD-10-CM | POA: Diagnosis not present

## 2018-06-09 DIAGNOSIS — N393 Stress incontinence (female) (male): Secondary | ICD-10-CM | POA: Diagnosis not present

## 2018-06-09 DIAGNOSIS — G43909 Migraine, unspecified, not intractable, without status migrainosus: Secondary | ICD-10-CM | POA: Diagnosis not present

## 2018-06-09 DIAGNOSIS — N8110 Cystocele, unspecified: Secondary | ICD-10-CM | POA: Diagnosis not present

## 2018-06-10 DIAGNOSIS — J019 Acute sinusitis, unspecified: Secondary | ICD-10-CM | POA: Diagnosis not present

## 2018-06-10 DIAGNOSIS — J4521 Mild intermittent asthma with (acute) exacerbation: Secondary | ICD-10-CM | POA: Diagnosis not present

## 2018-06-10 DIAGNOSIS — J45909 Unspecified asthma, uncomplicated: Secondary | ICD-10-CM | POA: Insufficient documentation

## 2018-06-15 DIAGNOSIS — R05 Cough: Secondary | ICD-10-CM | POA: Diagnosis not present

## 2018-06-26 ENCOUNTER — Ambulatory Visit (INDEPENDENT_AMBULATORY_CARE_PROVIDER_SITE_OTHER): Payer: 59

## 2018-06-26 ENCOUNTER — Ambulatory Visit: Payer: 59 | Admitting: Family Medicine

## 2018-06-26 ENCOUNTER — Encounter: Payer: Self-pay | Admitting: Family Medicine

## 2018-06-26 VITALS — BP 126/80 | HR 79 | Temp 98.7°F | Resp 16 | Ht 66.0 in | Wt 232.4 lb

## 2018-06-26 DIAGNOSIS — R059 Cough, unspecified: Secondary | ICD-10-CM

## 2018-06-26 DIAGNOSIS — R05 Cough: Secondary | ICD-10-CM

## 2018-06-26 DIAGNOSIS — J9809 Other diseases of bronchus, not elsewhere classified: Secondary | ICD-10-CM | POA: Diagnosis not present

## 2018-06-26 NOTE — Progress Notes (Signed)
Subjective:    Patient ID: Kristen Carey, female    DOB: [birth year], 38 y.o.   MRN: [id number]  HPI   Patient presents to clinic due to continued cough for 4 weeks.  Patient has been seen at 2 different urgent cares for this symptom and has been treated with 2 different courses of antibiotics -doxycycline course and clarithromycin course.  She has also taken high-dose steroid taper.  Patient denies any history of COPD or asthma.  She is an everyday cigarette smoker.  Denies phlegm production with cough.  States cough is dry and hacking.  States sometimes she will lose her breath with coughing.  She does have albuterol inhaler that was given to her by the urgent care, states does not seem to notice much of a difference when using the inhaler.  Patient Active Problem List   Diagnosis Date Noted  . Anxiety 01/22/2018  . Vitamin D deficiency 01/22/2018  . Prediabetes 01/22/2018  . Epigastric abdominal pain 07/24/2016  . Premature contractions, cardiac 03/04/2016  . Chronic fatigue 09/28/2015  . Concentration deficit 09/28/2015  . Cough 05/05/2012  . Tobacco abuse 05/05/2012  . Obesity (BMI 30-39.9) 05/05/2012  . Migraines 05/05/2012   Social History   Tobacco Use  . Smoking status: Current Every Day Smoker    Packs/day: 1.00  . Smokeless tobacco: Never Used  Substance Use Topics  . Alcohol use: Yes    Alcohol/week: 0.0 standard drinks   Review of Systems  Constitutional: Negative for chills, fatigue and fever.  HENT: Negative for congestion, ear pain, sinus pain and sore throat.   Eyes: Negative.   Respiratory: +cough, chest congestion, shortness of breath and wheezing.   Cardiovascular: Negative for chest pain, palpitations and leg swelling.  Gastrointestinal: Negative for abdominal pain, diarrhea, nausea and vomiting.  Genitourinary: Negative for dysuria, frequency and urgency.  Musculoskeletal: Negative for arthralgias and myalgias.  Skin: Negative for color  change, pallor and rash.  Neurological: Negative for syncope, light-headedness and headaches.  Psychiatric/Behavioral: The patient is not nervous/anxious.       Objective:   Physical Exam   Constitutional: She appears well-developed and well-nourished. No distress.  HENT:  Head: Normocephalic and atraumatic.  Eyes: Pupils are equal, round, and reactive to light. EOM are normal. No scleral icterus.  Neck: Normal range of motion. Neck supple. No tracheal deviation present.  Cardiovascular: Normal rate, regular rhythm and normal heart sounds.  Pulmonary/Chest: Effort normal and breath sounds normal. No respiratory distress. She has no wheezes. She has no rales. No rhonchi. Cough does sound harsh/barking.  Neurological: She is alert and oriented to person, place, and time.  Gait normal  Skin: Skin is warm and dry. No pallor.  Psychiatric: She has a normal mood and affect. Her behavior is normal. Thought content normal.  Nursing note and vitals reviewed.   Vitals:   06/26/18 1429  BP: 126/80  Pulse: 79  Resp: 16  Temp: 98.7 F (37.1 C)  SpO2: 97%       Assessment & Plan:   A total of 25  minutes were spent face-to-face with the patient during this encounter and over half of that time was spent on counseling and coordination of care. The patient was counseled on work up planned, why another antibiotic at this time is not a good idea unless CXR shows pneumonia, possibility of a reactive or obstructive airway disease.   Persistent cough - patient has taken 2 courses of antibiotics and still no resolution of  cough.  I do not think cough is related to a bacterial syndrome we will do chest x-ray in clinic today as she has not had 1 done to investigate this cough.  She will begin Breo inhaler once daily, patient given sample of Breo from our sample medication supply in the office.  She will continue to use albuterol as needed for any wheezing or shortness of breath.  Advised to continue use  of Mucinex as recommended by the urgent care.  We will see what chest x-ray results reveal and also have patient follow-up next week to see if Virgel Bouquet has made any sort of difference.  If the cough continues even after addition of Breo, we will plan to refer to pulmonology for further evaluation of this cough.

## 2018-06-26 NOTE — Patient Instructions (Signed)
Start BREO inhaler once daily  Use albuterol as needed

## 2018-07-03 ENCOUNTER — Ambulatory Visit: Payer: 59 | Admitting: Family Medicine

## 2018-07-25 DIAGNOSIS — R0781 Pleurodynia: Secondary | ICD-10-CM | POA: Diagnosis not present

## 2018-07-25 DIAGNOSIS — J9801 Acute bronchospasm: Secondary | ICD-10-CM | POA: Diagnosis not present

## 2019-03-16 DIAGNOSIS — Z113 Encounter for screening for infections with a predominantly sexual mode of transmission: Secondary | ICD-10-CM | POA: Diagnosis not present

## 2019-03-16 DIAGNOSIS — R309 Painful micturition, unspecified: Secondary | ICD-10-CM | POA: Diagnosis not present

## 2019-06-10 ENCOUNTER — Ambulatory Visit: Payer: BC Managed Care – PPO | Attending: Internal Medicine

## 2019-06-10 DIAGNOSIS — Z20822 Contact with and (suspected) exposure to covid-19: Secondary | ICD-10-CM

## 2019-06-11 LAB — NOVEL CORONAVIRUS, NAA: SARS-CoV-2, NAA: NOT DETECTED

## 2019-06-24 ENCOUNTER — Ambulatory Visit: Payer: BC Managed Care – PPO | Attending: Internal Medicine

## 2019-06-24 ENCOUNTER — Other Ambulatory Visit: Payer: BC Managed Care – PPO

## 2019-06-24 DIAGNOSIS — Z20822 Contact with and (suspected) exposure to covid-19: Secondary | ICD-10-CM | POA: Diagnosis not present

## 2019-06-24 DIAGNOSIS — L710 Perioral dermatitis: Secondary | ICD-10-CM | POA: Diagnosis not present

## 2019-06-24 DIAGNOSIS — L720 Epidermal cyst: Secondary | ICD-10-CM | POA: Diagnosis not present

## 2019-06-25 LAB — SPECIMEN STATUS REPORT

## 2019-06-25 LAB — NOVEL CORONAVIRUS, NAA: SARS-CoV-2, NAA: NOT DETECTED

## 2019-07-05 DIAGNOSIS — M545 Low back pain: Secondary | ICD-10-CM | POA: Diagnosis not present

## 2019-07-14 ENCOUNTER — Other Ambulatory Visit: Payer: Self-pay | Admitting: Physician Assistant

## 2019-07-14 DIAGNOSIS — K589 Irritable bowel syndrome without diarrhea: Secondary | ICD-10-CM | POA: Insufficient documentation

## 2019-07-14 DIAGNOSIS — M5441 Lumbago with sciatica, right side: Secondary | ICD-10-CM

## 2019-07-14 DIAGNOSIS — M5442 Lumbago with sciatica, left side: Secondary | ICD-10-CM | POA: Diagnosis not present

## 2019-07-22 DIAGNOSIS — Z6833 Body mass index (BMI) 33.0-33.9, adult: Secondary | ICD-10-CM | POA: Diagnosis not present

## 2019-07-22 DIAGNOSIS — Z01419 Encounter for gynecological examination (general) (routine) without abnormal findings: Secondary | ICD-10-CM | POA: Diagnosis not present

## 2019-07-22 DIAGNOSIS — J45909 Unspecified asthma, uncomplicated: Secondary | ICD-10-CM | POA: Diagnosis not present

## 2019-07-22 DIAGNOSIS — G43909 Migraine, unspecified, not intractable, without status migrainosus: Secondary | ICD-10-CM | POA: Diagnosis not present

## 2019-07-22 DIAGNOSIS — E538 Deficiency of other specified B group vitamins: Secondary | ICD-10-CM | POA: Diagnosis not present

## 2019-07-22 DIAGNOSIS — Z131 Encounter for screening for diabetes mellitus: Secondary | ICD-10-CM | POA: Diagnosis not present

## 2019-07-22 DIAGNOSIS — Z13220 Encounter for screening for lipoid disorders: Secondary | ICD-10-CM | POA: Diagnosis not present

## 2019-07-22 DIAGNOSIS — Z124 Encounter for screening for malignant neoplasm of cervix: Secondary | ICD-10-CM | POA: Diagnosis not present

## 2019-07-22 DIAGNOSIS — Z113 Encounter for screening for infections with a predominantly sexual mode of transmission: Secondary | ICD-10-CM | POA: Diagnosis not present

## 2019-08-18 DIAGNOSIS — M5117 Intervertebral disc disorders with radiculopathy, lumbosacral region: Secondary | ICD-10-CM | POA: Diagnosis not present

## 2019-08-18 DIAGNOSIS — M5116 Intervertebral disc disorders with radiculopathy, lumbar region: Secondary | ICD-10-CM | POA: Diagnosis not present

## 2019-08-19 DIAGNOSIS — M5136 Other intervertebral disc degeneration, lumbar region: Secondary | ICD-10-CM | POA: Diagnosis not present

## 2019-08-19 DIAGNOSIS — M5416 Radiculopathy, lumbar region: Secondary | ICD-10-CM | POA: Diagnosis not present

## 2019-08-19 DIAGNOSIS — M5440 Lumbago with sciatica, unspecified side: Secondary | ICD-10-CM | POA: Diagnosis not present

## 2019-08-20 DIAGNOSIS — M5417 Radiculopathy, lumbosacral region: Secondary | ICD-10-CM | POA: Insufficient documentation

## 2019-08-20 DIAGNOSIS — Z6835 Body mass index (BMI) 35.0-35.9, adult: Secondary | ICD-10-CM | POA: Diagnosis not present

## 2019-08-25 DIAGNOSIS — M5417 Radiculopathy, lumbosacral region: Secondary | ICD-10-CM | POA: Diagnosis not present

## 2019-08-25 DIAGNOSIS — M5416 Radiculopathy, lumbar region: Secondary | ICD-10-CM | POA: Diagnosis not present

## 2019-08-27 DIAGNOSIS — M5441 Lumbago with sciatica, right side: Secondary | ICD-10-CM | POA: Diagnosis not present

## 2019-08-27 DIAGNOSIS — M5442 Lumbago with sciatica, left side: Secondary | ICD-10-CM | POA: Diagnosis not present

## 2019-08-27 DIAGNOSIS — M5116 Intervertebral disc disorders with radiculopathy, lumbar region: Secondary | ICD-10-CM | POA: Diagnosis not present

## 2019-08-27 DIAGNOSIS — M5117 Intervertebral disc disorders with radiculopathy, lumbosacral region: Secondary | ICD-10-CM | POA: Diagnosis not present

## 2019-09-02 ENCOUNTER — Other Ambulatory Visit: Payer: Self-pay

## 2019-09-02 ENCOUNTER — Ambulatory Visit: Payer: BC Managed Care – PPO | Attending: Physician Assistant | Admitting: Physical Therapy

## 2019-09-02 DIAGNOSIS — M2560 Stiffness of unspecified joint, not elsewhere classified: Secondary | ICD-10-CM

## 2019-09-02 DIAGNOSIS — M6281 Muscle weakness (generalized): Secondary | ICD-10-CM | POA: Insufficient documentation

## 2019-09-02 DIAGNOSIS — M5441 Lumbago with sciatica, right side: Secondary | ICD-10-CM | POA: Diagnosis not present

## 2019-09-02 DIAGNOSIS — M5442 Lumbago with sciatica, left side: Secondary | ICD-10-CM | POA: Diagnosis not present

## 2019-09-02 NOTE — Patient Instructions (Signed)
Access Code: [email or web address]: [email or web address]: 04/15/2021Prepared by: Casimiro Needle SherkExercises  Prone Press Up on Elbows - 1 x daily - 7 x weekly - 1 sets - 10 reps  Standing Lumbar Extension - 1 x daily - 7 x weekly - 1 sets - 10 reps  Seated Hamstring Stretch - 1 x daily - 7 x weekly - 1 sets - 3 reps - 20 second hold

## 2019-09-06 DIAGNOSIS — M5116 Intervertebral disc disorders with radiculopathy, lumbar region: Secondary | ICD-10-CM | POA: Diagnosis not present

## 2019-09-06 DIAGNOSIS — G43809 Other migraine, not intractable, without status migrainosus: Secondary | ICD-10-CM | POA: Diagnosis not present

## 2019-09-06 DIAGNOSIS — K589 Irritable bowel syndrome without diarrhea: Secondary | ICD-10-CM | POA: Diagnosis not present

## 2019-09-06 DIAGNOSIS — Z720 Tobacco use: Secondary | ICD-10-CM | POA: Diagnosis not present

## 2019-09-06 DIAGNOSIS — E538 Deficiency of other specified B group vitamins: Secondary | ICD-10-CM | POA: Diagnosis not present

## 2019-09-06 DIAGNOSIS — Z01818 Encounter for other preprocedural examination: Secondary | ICD-10-CM | POA: Diagnosis not present

## 2019-09-06 DIAGNOSIS — R7303 Prediabetes: Secondary | ICD-10-CM | POA: Diagnosis not present

## 2019-09-06 DIAGNOSIS — E559 Vitamin D deficiency, unspecified: Secondary | ICD-10-CM | POA: Diagnosis not present

## 2019-09-06 DIAGNOSIS — F419 Anxiety disorder, unspecified: Secondary | ICD-10-CM | POA: Diagnosis not present

## 2019-09-06 DIAGNOSIS — I4949 Other premature depolarization: Secondary | ICD-10-CM | POA: Diagnosis not present

## 2019-09-06 DIAGNOSIS — Z975 Presence of (intrauterine) contraceptive device: Secondary | ICD-10-CM | POA: Diagnosis not present

## 2019-09-06 DIAGNOSIS — J45909 Unspecified asthma, uncomplicated: Secondary | ICD-10-CM | POA: Diagnosis not present

## 2019-09-06 DIAGNOSIS — E669 Obesity, unspecified: Secondary | ICD-10-CM | POA: Diagnosis not present

## 2019-09-06 DIAGNOSIS — R5382 Chronic fatigue, unspecified: Secondary | ICD-10-CM | POA: Diagnosis not present

## 2019-09-07 ENCOUNTER — Other Ambulatory Visit: Payer: Self-pay

## 2019-09-07 ENCOUNTER — Ambulatory Visit: Payer: BC Managed Care – PPO | Admitting: Physical Therapy

## 2019-09-07 DIAGNOSIS — M5442 Lumbago with sciatica, left side: Secondary | ICD-10-CM

## 2019-09-07 DIAGNOSIS — M6281 Muscle weakness (generalized): Secondary | ICD-10-CM

## 2019-09-07 DIAGNOSIS — M5441 Lumbago with sciatica, right side: Secondary | ICD-10-CM

## 2019-09-07 DIAGNOSIS — M2560 Stiffness of unspecified joint, not elsewhere classified: Secondary | ICD-10-CM | POA: Diagnosis not present

## 2019-09-09 ENCOUNTER — Encounter: Payer: Self-pay | Admitting: Physical Therapy

## 2019-09-09 NOTE — Therapy (Addendum)
[place] [facility] [address] [place], [place], [address] Phone: [phone]   Fax:  [phone]  Physical Therapy Evaluation  Patient Details  Name: Kristen Carey MRN: [id number] Date of Birth: [birth year] No data recorded  Encounter Date: 09/02/2019   PT End of Session - 09/09/19 0732    Visit Number  1    Number of Visits  5    Date for PT Re-Evaluation  09/30/19    Authorization - Visit Number  1    Authorization - Number of Visits  10    PT Start Time  0731    PT Stop Time  0826    PT Time Calculation (min)  55 min    Activity Tolerance  Patient tolerated treatment well;Patient limited by pain    Behavior During Therapy  [facility] for tasks assessed/performed         Past Medical History:  Diagnosis Date  . Anxiety   . Eczema   . Migraines     Past Surgical History:  Procedure Laterality Date  . CESAREAN SECTION  1999  . TONSILLECTOMY AND ADENOIDECTOMY  1991    There were no vitals filed for this visit.   Subjective Assessment - 10/03/19 1653    Subjective  Pt. referred to PT secondary to acute bilateral low back pain with bilateral sciatica.  Pt. diagnosed with lumbar disc herniation with radiculopathy.  Pt. reports pain into L LE (past knee). Pt. states disc involvment at left L4-5, L5-S1.    Pertinent History  Pt. known to PT clinic.  Pt. has h/o low back pain over past several years but reports improvement with rest/ modalities.  Pt. states the back pain is different this time and started over the past 6 months.  Her current flare consists of pain across the lower back and into the anterior thighs, L>R. She reports numbness. Some difficulty lifting the left leg as compared to the right.    Diagnostic tests  see MRI/ imaging    Patient Stated Goals  Decrease back pain.  Pt. scheduled for surgery on 4/29 and want to try PT to see if it will help.  Pt. is not 100% sure she wants surgery.    Currently in Pain?  Yes     Pain Score  5     Pain Location  Back    Pain Orientation  Right;Left;Lower    Pain Type  Chronic pain         See flowsheet    Objective measurements completed on examination: See above findings.     See HEP Discussed MH vs. Ice.  Benefits of lumbar extension/ proper posture.     PT Education - 10/03/19 1700    Education Details  See HEP    Person(s) Educated  Patient    Methods  Explanation;Demonstration;Handout    Comprehension  Verbalized understanding;Returned demonstration          PT Long Term Goals - 10/03/19 1711      PT LONG TERM GOAL #1   Title  Pt. will increase FOTO to 57 to improve pain-free functional mobility.    Baseline  Initial FOTO: 31    Time  4    Period  Weeks    Status  New    Target Date  09/30/19      PT LONG TERM GOAL #2   Title  Pt. will report no L LE radicular symptmos with work related tasks to improve  pain-free mobility.    Baseline  Persistent low back pain with L LE radicular symptoms.    Time  4    Period  Weeks    Status  New    Target Date  09/30/19      PT LONG TERM GOAL #3   Title  Pt. will be independent with HEP to increase core/ hip strengthening to 5/5 MMT to improve pain-free mobility.    Baseline  B LE muscle strength 5/5 MMT except hip flexion 4+/5 MMT.    Time  4    Period  Weeks    Status  New    Target Date  09/30/19         Plan - 10/03/19 1701    Clinical Impression Statement  Pt. is a pleasant 39 y/o with chronic h/o low back pain with more recent flare resulting in low back pain/ L radicular symptoms.  Pt. reports persistent low back pain and has missed work due to pain.  MRI report reveals disc herniation at L4-5, L5-S1 resulting in L LE radicular symptoms.  Pt. presents with good lumbar AROM WFL with increase pain in flexion (no improvement with repeated movement).  B LE muscle strength 5/5 MMT except hip flexion 4+/5 MMT.  Pt. instructed in lumbar extension/ neural glides to sciatic nerve.  (+) SLR  on L.  FOTO: initial 31/ goal 57.  Pt. ambulates with antalgic, stiff gait pattern with increase c/o pain symptoms.  Pt. is scheduled for microdiscectomy on 09/16/19 and wants to try PT.  Pt. will benefit from skilled PT services to develop core stability/ stretching program to decrease low back pain/ radicular symptoms.    Stability/Clinical Decision Making  Evolving/Moderate complexity    Clinical Decision Making  Moderate    Rehab Potential  Good    PT Frequency  1x / week    PT Duration  4 weeks    PT Treatment/Interventions  Cryotherapy;Electrical Stimulation;ADLs/Self Care Home Management;Moist Heat;Gait training;Stair training;Functional mobility training;Neuromuscular re-education;Balance training;Therapeutic activities;Therapeutic exercise;Patient/family education;Manual techniques;Passive range of motion    PT Next Visit Plan  Discuss HEP/ reassess SLR    PT Home Exercise Plan  [email or web address]       Patient will benefit from skilled therapeutic intervention in order to improve the following deficits and impairments:  Abnormal gait, Decreased endurance, Decreased mobility, Difficulty walking, Improper body mechanics, Decreased knowledge of precautions, Decreased activity tolerance, Decreased strength, Hypomobility, Impaired flexibility, Pain  Visit Diagnosis: Acute bilateral low back pain with bilateral sciatica  Muscle weakness (generalized)  Joint stiffness     Problem List Patient Active Problem List   Diagnosis Date Noted  . Anxiety 01/22/2018  . Vitamin D deficiency 01/22/2018  . Prediabetes 01/22/2018  . Epigastric abdominal pain 07/24/2016  . Premature contractions, cardiac 03/04/2016  . Chronic fatigue 09/28/2015  . Concentration deficit 09/28/2015  . Cough 05/05/2012  . Tobacco abuse 05/05/2012  . Obesity (BMI 30-39.9) 05/05/2012  . Migraines 05/05/2012   Cammie Mcgee, PT, DPT # [phone] 10/03/2019, 5:15 PM  [place] [facility] [address] [place], [place], [address] Phone: [phone]   Fax:  [phone]  Name: Kristen Carey MRN: [id number] Date of Birth: [birth year]

## 2019-09-11 NOTE — Therapy (Addendum)
[place] [facility] [address] [place], [place], [address] Phone: [phone]   Fax:  [phone]  Physical Therapy Treatment  Patient Details  Name: Kristen Carey MRN: [id number] Date of Birth: [birth year] No data recorded  Encounter Date: 09/07/2019   PT End of Session - 09/11/19 0904    Visit Number  2    Number of Visits  5    Date for PT Re-Evaluation  09/30/19    Authorization - Visit Number  2    Authorization - Number of Visits  10    PT Start Time  0732    PT Stop Time  0820    PT Time Calculation (min)  48 min    Activity Tolerance  Patient tolerated treatment well;Patient limited by pain    Behavior During Therapy  [facility] for tasks assessed/performed         Past Medical History:  Diagnosis Date  . Anxiety   . Eczema   . Migraines     Past Surgical History:  Procedure Laterality Date  . CESAREAN SECTION  1999  . TONSILLECTOMY AND ADENOIDECTOMY  1991    There were no vitals filed for this visit.  Subjective Assessment - 10/10/19 0916    Subjective  Pt. scheduled for lumbar discectomy next week (4/29).  Pt. reports continued c/o low back pain.    Pertinent History  Pt. known to PT clinic.  Pt. has h/o low back pain over past several years but reports improvement with rest/ modalities.  Pt. states the back pain is different this time and started over the past 6 months.  Her current flare consists of pain across the lower back and into the anterior thighs, L>R. She reports numbness. Some difficulty lifting the left leg as compared to the right.    Diagnostic tests  see MRI/ imaging    Patient Stated Goals  Decrease back pain.  Pt. scheduled for surgery on 4/29 and want to try PT to see if it will help.  Pt. is not 100% sure she wants surgery.    Currently in Pain?  Yes         There.ex.:   Reviewed HEP/ palpation of TrA muscle activation.   Manual tx.:  Supine LE/lumbar stretches (generalized) Prone STM  to lumbar paraspinals/ grade II-III Low thoracic/lumbar region (unilateral) 1x20 sec.       PT Long Term Goals - 10/10/19 5916      PT LONG TERM GOAL #1   Title  Pt. will increase FOTO to 57 to improve pain-free functional mobility.    Baseline  Initial FOTO: 31    Time  4    Period  Weeks    Status  Not Met    Target Date  09/07/19      PT LONG TERM GOAL #2   Title  Pt. will report no L LE radicular symptmos with work related tasks to improve pain-free mobility.    Baseline  Persistent low back pain with L LE radicular symptoms.    Time  4    Period  Weeks    Status  Not Met    Target Date  09/07/19      PT LONG TERM GOAL #3   Title  Pt. will be independent with HEP to increase core/ hip strengthening to 5/5 MMT to improve pain-free mobility.    Baseline  B LE muscle strength 5/5 MMT except hip flexion 4+/5 MMT.  Time  4    Period  Weeks    Status  Not Met    Target Date  09/07/19            Plan - 10/10/19 0076    Clinical Impression Statement  Pt. understands importance of core stability ex. and TrA muscle contraction.  Pt. will continue with ex. program and avoid any pain provoking movement patterns.  Pt. planning on lumbar surgery next week and instructed to contact PT with any questions or issues.  Pt. will be discharged from PT at this time and may benefit from additional PT after surgery if pain/ issues.    Examination-Activity Limitations  Toileting    Stability/Clinical Decision Making  Evolving/Moderate complexity    Clinical Decision Making  Moderate    Rehab Potential  Good    PT Frequency  1x / week    PT Duration  4 weeks    PT Treatment/Interventions  Cryotherapy;Electrical Stimulation;ADLs/Self Care Home Management;Moist Heat;Gait training;Stair training;Functional mobility training;Neuromuscular re-education;Balance training;Therapeutic activities;Therapeutic exercise;Patient/family education;Manual techniques;Passive range of motion    PT Next  Visit Plan  Discharge from PT secondary to surgery.    PT Home Exercise Plan  [email or web address]       Patient will benefit from skilled therapeutic intervention in order to improve the following deficits and impairments:  Abnormal gait, Decreased endurance, Decreased mobility, Difficulty walking, Improper body mechanics, Decreased knowledge of precautions, Decreased activity tolerance, Decreased strength, Hypomobility, Impaired flexibility, Pain  Visit Diagnosis: Acute bilateral low back pain with bilateral sciatica  Muscle weakness (generalized)  Joint stiffness     Problem List Patient Active Problem List   Diagnosis Date Noted  . Anxiety 01/22/2018  . Vitamin D deficiency 01/22/2018  . Prediabetes 01/22/2018  . Epigastric abdominal pain 07/24/2016  . Premature contractions, cardiac 03/04/2016  . Chronic fatigue 09/28/2015  . Concentration deficit 09/28/2015  . Cough 05/05/2012  . Tobacco abuse 05/05/2012  . Obesity (BMI 30-39.9) 05/05/2012  . Migraines 05/05/2012   Pura Spice, PT, DPT # [phone] 10/10/2019, 9:28 AM  [place] [facility] [address] [place], [place], [address] Phone: [phone]   Fax:  [phone]  Name: Kristen Carey MRN: [id number] Date of Birth: [birth year]

## 2019-09-13 DIAGNOSIS — Z01818 Encounter for other preprocedural examination: Secondary | ICD-10-CM | POA: Diagnosis not present

## 2019-09-13 DIAGNOSIS — Z20822 Contact with and (suspected) exposure to covid-19: Secondary | ICD-10-CM | POA: Diagnosis not present

## 2019-09-16 DIAGNOSIS — K589 Irritable bowel syndrome without diarrhea: Secondary | ICD-10-CM | POA: Diagnosis not present

## 2019-09-16 DIAGNOSIS — Z880 Allergy status to penicillin: Secondary | ICD-10-CM | POA: Diagnosis not present

## 2019-09-16 DIAGNOSIS — E538 Deficiency of other specified B group vitamins: Secondary | ICD-10-CM | POA: Diagnosis not present

## 2019-09-16 DIAGNOSIS — E559 Vitamin D deficiency, unspecified: Secondary | ICD-10-CM | POA: Diagnosis not present

## 2019-09-16 DIAGNOSIS — M5117 Intervertebral disc disorders with radiculopathy, lumbosacral region: Secondary | ICD-10-CM | POA: Diagnosis not present

## 2019-09-16 DIAGNOSIS — Z975 Presence of (intrauterine) contraceptive device: Secondary | ICD-10-CM | POA: Diagnosis not present

## 2019-09-16 DIAGNOSIS — F17200 Nicotine dependence, unspecified, uncomplicated: Secondary | ICD-10-CM | POA: Diagnosis not present

## 2019-09-16 DIAGNOSIS — F419 Anxiety disorder, unspecified: Secondary | ICD-10-CM | POA: Diagnosis not present

## 2019-09-16 DIAGNOSIS — J45909 Unspecified asthma, uncomplicated: Secondary | ICD-10-CM | POA: Diagnosis not present

## 2019-09-16 DIAGNOSIS — Z79899 Other long term (current) drug therapy: Secondary | ICD-10-CM | POA: Diagnosis not present

## 2019-09-16 DIAGNOSIS — E669 Obesity, unspecified: Secondary | ICD-10-CM | POA: Diagnosis not present

## 2019-09-16 DIAGNOSIS — Z6834 Body mass index (BMI) 34.0-34.9, adult: Secondary | ICD-10-CM | POA: Diagnosis not present

## 2019-09-16 DIAGNOSIS — M5116 Intervertebral disc disorders with radiculopathy, lumbar region: Secondary | ICD-10-CM | POA: Diagnosis not present

## 2019-09-16 DIAGNOSIS — G43809 Other migraine, not intractable, without status migrainosus: Secondary | ICD-10-CM | POA: Diagnosis not present

## 2019-09-17 DIAGNOSIS — Z6834 Body mass index (BMI) 34.0-34.9, adult: Secondary | ICD-10-CM | POA: Diagnosis not present

## 2019-09-17 DIAGNOSIS — E538 Deficiency of other specified B group vitamins: Secondary | ICD-10-CM | POA: Diagnosis not present

## 2019-09-17 DIAGNOSIS — E559 Vitamin D deficiency, unspecified: Secondary | ICD-10-CM | POA: Diagnosis not present

## 2019-09-17 DIAGNOSIS — M5116 Intervertebral disc disorders with radiculopathy, lumbar region: Secondary | ICD-10-CM | POA: Diagnosis not present

## 2019-09-17 DIAGNOSIS — F419 Anxiety disorder, unspecified: Secondary | ICD-10-CM | POA: Diagnosis not present

## 2019-09-17 DIAGNOSIS — Z79899 Other long term (current) drug therapy: Secondary | ICD-10-CM | POA: Diagnosis not present

## 2019-09-17 DIAGNOSIS — K589 Irritable bowel syndrome without diarrhea: Secondary | ICD-10-CM | POA: Diagnosis not present

## 2019-09-17 DIAGNOSIS — Z880 Allergy status to penicillin: Secondary | ICD-10-CM | POA: Diagnosis not present

## 2019-09-17 DIAGNOSIS — E669 Obesity, unspecified: Secondary | ICD-10-CM | POA: Diagnosis not present

## 2019-09-17 DIAGNOSIS — F17209 Nicotine dependence, unspecified, with unspecified nicotine-induced disorders: Secondary | ICD-10-CM | POA: Diagnosis not present

## 2019-09-17 DIAGNOSIS — F17200 Nicotine dependence, unspecified, uncomplicated: Secondary | ICD-10-CM | POA: Diagnosis not present

## 2019-09-17 DIAGNOSIS — J45909 Unspecified asthma, uncomplicated: Secondary | ICD-10-CM | POA: Diagnosis not present

## 2019-09-17 DIAGNOSIS — G43809 Other migraine, not intractable, without status migrainosus: Secondary | ICD-10-CM | POA: Diagnosis not present

## 2019-09-29 DIAGNOSIS — Z9889 Other specified postprocedural states: Secondary | ICD-10-CM | POA: Diagnosis not present

## 2019-10-03 NOTE — Addendum Note (Signed)
Addended by: Cammie Mcgee on: 10/03/2019 05:18 PM   Modules accepted: Orders

## 2019-11-03 ENCOUNTER — Other Ambulatory Visit: Payer: Self-pay

## 2019-11-03 ENCOUNTER — Encounter: Payer: Self-pay | Admitting: Physical Therapy

## 2019-11-03 ENCOUNTER — Ambulatory Visit: Payer: BC Managed Care – PPO | Attending: Physician Assistant | Admitting: Physical Therapy

## 2019-11-03 DIAGNOSIS — M6281 Muscle weakness (generalized): Secondary | ICD-10-CM | POA: Insufficient documentation

## 2019-11-03 DIAGNOSIS — M2560 Stiffness of unspecified joint, not elsewhere classified: Secondary | ICD-10-CM

## 2019-11-03 DIAGNOSIS — Z9889 Other specified postprocedural states: Secondary | ICD-10-CM | POA: Diagnosis not present

## 2019-11-03 DIAGNOSIS — M5442 Lumbago with sciatica, left side: Secondary | ICD-10-CM | POA: Insufficient documentation

## 2019-11-03 DIAGNOSIS — M5441 Lumbago with sciatica, right side: Secondary | ICD-10-CM | POA: Diagnosis not present

## 2019-11-03 NOTE — Therapy (Signed)
[facility] [address] [place], [place], [address] Phone: [phone]   Fax:  [phone]  Physical Therapy Evaluation  Patient Details  Name: Kristen Carey MRN: [id number] Date of Birth: [birth year] Referring Provider (PT): Loleta Chance, MD   Encounter Date: 11/03/2019   PT End of Session - 11/03/19 1119    Visit Number 1    Number of Visits 6    Date for PT Re-Evaluation 12/01/19    Authorization - Visit Number 1    Authorization - Number of Visits 10    PT Start Time 0730    PT Stop Time 0817    PT Time Calculation (min) 47 min    Activity Tolerance Patient tolerated treatment well    Behavior During Therapy [facility] for tasks assessed/performed           Past Medical History:  Diagnosis Date   Anxiety    Eczema    Migraines     Past Surgical History:  Procedure Laterality Date   CESAREAN SECTION  1999   TONSILLECTOMY AND ADENOIDECTOMY  1991    There were no vitals filed for this visit.    Subjective Assessment - 11/03/19 1110    Subjective Pt. s/p lumbar discetomy 6 weeks ago. Pt. states that she is on spinal precautions for the next 6 weeks. She reports some pain upon awakening (2/10) and some pain after a long day on her feet (5/10). Pt. states that her L leg is still numb with some phantom pain occasionally, and is experiencing muslce spasms in her mid-low back occasionally. Ice tends to relieve pain/discomfort and helps with the spasms. She states she sleeps in multiple different positions throughout the night, which can cause pain/discomfort until she repositions.    Pertinent History Pt. known to PT clinic.  Pt. has h/o low back pain over past several years but reports improvement with rest/ modalities.  Pt. states the back pain is different this time and started over the past 6 months.  Her current flare consists of pain across the lower back and into the anterior thighs, L>R. She reports numbness.  Some difficulty lifting the left leg as compared to the right.    Limitations Lifting    Diagnostic tests --    Patient Stated Goals get back to normal    Currently in Pain? Yes    Pain Score 2     Pain Location Back    Pain Orientation Right;Left;Lower              Strength: pt's bilat LE strength is grossly 5/5 with exception of bilat hip IR and ER at 4+/5. Pt.'s requires tactile and verbal cueing to maintain core muscle activation during dynamic activities and position changes, was able to maintain activation in static positions.   Scar: Pt.'s scar appears normal with no redness, swelling, or tenderness to palpation.  Sensation: Pt's sensation is abnormal with numbness in L LE, however it is returning slowly.    There.Ex:  Hooklying TrA activtion Hooklying TrA activation with heel taps Standing RTB resisted side stepping with cueing to maintain core activation throughout Standing RTB resisted hip ext with cueing to maintain core activation to decrease excessive lordosis during activity.   Pt. was issued HEP.      Objective measurements completed on examination: See above findings.    See HEP    PT Education - 11/03/19 1119    Education Details see HEP  Person(s) Educated Patient    Methods Explanation;Demonstration;Handout    Comprehension Verbalized understanding;Returned demonstration               PT Long Term Goals - 11/03/19 1134      PT LONG TERM GOAL #1   Title Pt. will increase FOTO to 52 to improve pain-free functional mobiltiy.    Baseline initial FOTO: 36    Time 6    Period Weeks    Status New    Target Date 12/15/19      PT LONG TERM GOAL #2   Title Pt. will report less than a 5/10 on NPS after long work days to improve pain free function.    Baseline Pt. currently reports 5/10 pain on NPS after long work days    Time 6    Period Weeks    Status New    Target Date 12/15/19      PT LONG TERM GOAL #3   Title Pt. will decrease  5XSTS time to 15 seconds and without pain to improve pain free mobility.    Baseline Initial 5XSTS: 18.34 seconds    Time 4    Period Weeks    Status New    Target Date 12/15/19              Plan - 11/03/19 1124    Clinical Impression Statement Pt. is a 39 y.o. female who s/p lumbar discetomy 6 weeks ago.  Pt. is still on spinal precautions at this time.  Pt. demonstrates bilat LE strength WFL, with slight weakness in bilat hip IR and ER (4+/5 for all). Pt.'s bilat hamstring flexibility is WFL.  Pt.'s scar appears normal with no tenderness to palpation. Pt. demonstrates good core strength in static positions, however core stability is challenging in dynamic activities.  Pt. was able to complete dynamic activites while maintaining core activation with verbal and tactile cueing. Pt. will benefit from skilled PT to improve strength, decrease pain, and improve functional mobility.    Stability/Clinical Decision Making Evolving/Moderate complexity    Clinical Decision Making Moderate    Rehab Potential Good    PT Frequency 1x / week    PT Duration 6 weeks    PT Treatment/Interventions ADLs/Self Care Home Management;Aquatic Therapy;Cryotherapy;Electrical Stimulation;Functional mobility training;Therapeutic activities;Therapeutic exercise;Balance training;Neuromuscular re-education;Manual techniques;Passive range of motion;Scar mobilization;Stair training    PT Next Visit Plan Progress TrA exercises    Consulted and Agree with Plan of Care Patient           Patient will benefit from skilled therapeutic intervention in order to improve the following deficits and impairments:  Decreased activity tolerance, Decreased endurance, Decreased mobility, Decreased range of motion, Impaired sensation, Increased muscle spasms, Impaired perceived functional ability, Postural dysfunction  Visit Diagnosis: S/P lumbar discectomy  Muscle weakness (generalized)  Joint stiffness     Problem  List Patient Active Problem List   Diagnosis Date Noted   Anxiety 01/22/2018   Vitamin D deficiency 01/22/2018   Prediabetes 01/22/2018   Epigastric abdominal pain 07/24/2016   Premature contractions, cardiac 03/04/2016   Chronic fatigue 09/28/2015   Concentration deficit 09/28/2015   Cough 05/05/2012   Tobacco abuse 05/05/2012   Obesity (BMI 30-39.9) 05/05/2012   Migraines 05/05/2012   Cammie Mcgee, PT, DPT # [id number] Sharyn Creamer, SPT 11/04/2019, 8:43 AM  [place] [facility] [address] [place], [place], [address] Phone: [phone]   Fax:  [phone]  Name: Kristen Carey MRN:  [id number] Date of Birth: [birth year]

## 2019-11-03 NOTE — Patient Instructions (Signed)
Access Code: [email or web address]: [email or web address]: 06/16/2021Prepared by: Casimiro Needle SherkExercises  Standing Repeated Hip Extension with Resistance - 1 x daily - 7 x weekly - 3 sets - 10 reps  Hooklying Transversus Abdominis Palpation - 1 x daily - 7 x weekly - 10 reps - 3 sets  Supine March - 1 x daily - 7 x weekly - 3 sets - 10 reps

## 2019-11-10 ENCOUNTER — Other Ambulatory Visit: Payer: Self-pay

## 2019-11-10 ENCOUNTER — Ambulatory Visit: Payer: BC Managed Care – PPO | Admitting: Physical Therapy

## 2019-11-10 ENCOUNTER — Encounter: Payer: Self-pay | Admitting: Physical Therapy

## 2019-11-10 DIAGNOSIS — M2560 Stiffness of unspecified joint, not elsewhere classified: Secondary | ICD-10-CM | POA: Diagnosis not present

## 2019-11-10 DIAGNOSIS — M6281 Muscle weakness (generalized): Secondary | ICD-10-CM | POA: Diagnosis not present

## 2019-11-10 DIAGNOSIS — M5442 Lumbago with sciatica, left side: Secondary | ICD-10-CM

## 2019-11-10 DIAGNOSIS — M5441 Lumbago with sciatica, right side: Secondary | ICD-10-CM | POA: Diagnosis not present

## 2019-11-10 DIAGNOSIS — Z9889 Other specified postprocedural states: Secondary | ICD-10-CM

## 2019-11-10 NOTE — Patient Instructions (Signed)
Access Code: [email or web address]: [email or web address]: 06/23/2021Prepared by: Casimiro Needle SherkExercises  Hooklying Transversus Abdominis Palpation - 1 x daily - 7 x weekly - 5 reps - 1 sets  Supine March - 1 x daily - 7 x weekly - 1 sets - 20 reps  Supine Bridge - 1 x daily - 7 x weekly - 1 sets - 20 reps  Clamshell - 1 x daily - 7 x weekly - 1 sets - 20 reps  Side Stepping with Resistance at Thighs - 1 x daily - 7 x weekly - 1 sets - 10 reps  Standing Anti-Rotation Press with Anchored Resistance - 1 x daily - 7 x weekly - 1 sets - 10 reps

## 2019-11-10 NOTE — Therapy (Signed)
[place] [facility] [address] [place], [place], [address] Phone: [phone]   Fax:  [phone]  Physical Therapy Treatment  Patient Details  Name: Kristen Carey MRN: [id number] Date of Birth: 1980/08/31 Referring Provider (PT): Gara Kroner, MD   Encounter Date: 11/10/2019   PT End of Session - 11/10/19 0901    Visit Number 2    Number of Visits 6    Date for PT Re-Evaluation 12/01/19    Authorization - Visit Number 2    Authorization - Number of Visits 10    PT Start Time 0736    PT Stop Time 0805    PT Time Calculation (min) 29 min    Activity Tolerance Patient tolerated treatment well    Behavior During Therapy [facility] for tasks assessed/performed           Past Medical History:  Diagnosis Date  . Anxiety   . Eczema   . Migraines     Past Surgical History:  Procedure Laterality Date  . CESAREAN SECTION  1999  . TONSILLECTOMY AND ADENOIDECTOMY  1991    There were no vitals filed for this visit.   Subjective Assessment - 11/10/19 0859    Subjective Pt reports HEP has been going well besides standing hip extension which has caused her some discomfort.    Pertinent History Pt. known to PT clinic.  Pt. has h/o low back pain over past several years but reports improvement with rest/ modalities.  Pt. states the back pain is different this time and started over the past 6 months.  Her current flare consists of pain across the lower back and into the anterior thighs, L>R. She reports numbness. Some difficulty lifting the left leg as compared to the right.    Limitations Lifting    Patient Stated Goals get back to normal    Currently in Pain? No/denies    Pain Score 0-No pain          There.ex:   In supine: TrA activation with self palpation: 1x30 sec TrA activation with marches: 2x30 sec  TrA activation dead bugs: 3x30, pt required tactile cues and verbal cues for coordination of UE/LE movements and maintaining  active core  B side-lying clamshells: 1x30 sec/side  In standing:  Resisted B side-stepping with red TB above knees: 1x10 Paloff press with red TB: 2x5 L and R, with in verbal cues to prevent twisting of spine. Pt displayed improved carryover of maintaining spinal precautions after cueing.      PT Education - 11/10/19 0900    Education Details transversus and core strength progression. See HEP.    Person(s) Educated Patient    Methods Explanation;Demonstration;Tactile cues;Verbal cues    Comprehension Verbalized understanding;Returned demonstration               PT Long Term Goals - 11/03/19 1134      PT LONG TERM GOAL #1   Title Pt. will increase FOTO to 52 to improve pain-free functional mobiltiy.    Baseline initial FOTO: 36    Time 6    Period Weeks    Status New    Target Date 12/15/19      PT LONG TERM GOAL #2   Title Pt. will report less than a 5/10 on NPS after long work days to improve pain free function.    Baseline Pt. currently reports 5/10 pain on NPS after long work days    Time 6  Period Weeks    Status New    Target Date 12/15/19      PT LONG TERM GOAL #3   Title Pt. will decrease 5XSTS time to 15 seconds and without pain to improve pain free mobility.    Baseline Initial 5XSTS: 18.34 seconds    Time 4    Period Weeks    Status New    Target Date 12/15/19                 Plan - 11/10/19 0901    Clinical Impression Statement Pt demonstrated ability to perform core strengthening progression while maintaining lumbar spine precautions of no BLT. Pt required min tactile cues and re-education on palpation of transversus abdominus and activating core muscles during exercise. Pt displayed ability to maintain active core with dead bugs, glut bridge, and side-lying clam shells pain free. Pt performed side-stepping with red TB around knees with no pain and standing hip extension with resistance removed from HEP 2/2 pain with this exercise. Paloff  press performed with red TB with min verbal cues from therapist to prevent twisting. Pt can continue to benefit from skilled treatment to improve core activation applied to lifting mechanics and other ADL's.    Stability/Clinical Decision Making Evolving/Moderate complexity    Rehab Potential Good    PT Frequency 1x / week    PT Duration 6 weeks    PT Treatment/Interventions ADLs/Self Care Home Management;Aquatic Therapy;Cryotherapy;Electrical Stimulation;Functional mobility training;Therapeutic activities;Therapeutic exercise;Balance training;Neuromuscular re-education;Manual techniques;Passive range of motion;Scar mobilization;Stair training    PT Next Visit Plan Progress TrA exercises    PT Home Exercise Plan Added bridges, side-lying clam shells, resisted Red TB side stepping, and paloff press with red TB    Consulted and Agree with Plan of Care Patient           Patient will benefit from skilled therapeutic intervention in order to improve the following deficits and impairments:  Decreased activity tolerance, Decreased endurance, Decreased mobility, Decreased range of motion, Impaired sensation, Increased muscle spasms, Impaired perceived functional ability, Postural dysfunction  Visit Diagnosis: S/P lumbar discectomy  Muscle weakness (generalized)  Joint stiffness  Acute bilateral low back pain with bilateral sciatica     Problem List Patient Active Problem List   Diagnosis Date Noted  . Anxiety 01/22/2018  . Vitamin D deficiency 01/22/2018  . Prediabetes 01/22/2018  . Epigastric abdominal pain 07/24/2016  . Premature contractions, cardiac 03/04/2016  . Chronic fatigue 09/28/2015  . Concentration deficit 09/28/2015  . Cough 05/05/2012  . Tobacco abuse 05/05/2012  . Obesity (BMI 30-39.9) 05/05/2012  . Migraines 05/05/2012   Cammie Mcgee, PT, DPT # [id number], SPT 11/10/2019, 3:01 PM  [place] [facility] [address] [place], [place], [address] Phone: [phone]   Fax:  [phone]  Name: Kristen Carey MRN: [id number] Date of Birth: 05-03-1981

## 2019-11-17 ENCOUNTER — Ambulatory Visit: Payer: BC Managed Care – PPO | Admitting: Physical Therapy

## 2019-11-17 ENCOUNTER — Encounter: Payer: Self-pay | Admitting: Physical Therapy

## 2019-11-17 ENCOUNTER — Other Ambulatory Visit: Payer: Self-pay

## 2019-11-17 DIAGNOSIS — M2560 Stiffness of unspecified joint, not elsewhere classified: Secondary | ICD-10-CM

## 2019-11-17 DIAGNOSIS — M5441 Lumbago with sciatica, right side: Secondary | ICD-10-CM | POA: Diagnosis not present

## 2019-11-17 DIAGNOSIS — M5442 Lumbago with sciatica, left side: Secondary | ICD-10-CM | POA: Diagnosis not present

## 2019-11-17 DIAGNOSIS — Z9889 Other specified postprocedural states: Secondary | ICD-10-CM | POA: Diagnosis not present

## 2019-11-17 DIAGNOSIS — M6281 Muscle weakness (generalized): Secondary | ICD-10-CM

## 2019-11-17 NOTE — Therapy (Signed)
[place] [facility] [address] [place], [place], [address] Phone: [phone]   Fax:  [phone]  Physical Therapy Treatment  Patient Details  Name: Kristen Carey MRN: [id number] Date of Birth: January 10, 1981 Referring Provider (PT): Loleta Chance, MD   Encounter Date: 11/17/2019   PT End of Session - 11/17/19 3976    Visit Number 3    Number of Visits 6    Date for PT Re-Evaluation 12/01/19    Authorization - Visit Number 3    Authorization - Number of Visits 10    PT Start Time 0740    PT Stop Time 0822    PT Time Calculation (min) 42 min    Activity Tolerance Patient tolerated treatment well    Behavior During Therapy [facility] for tasks assessed/performed           Past Medical History:  Diagnosis Date   Anxiety    Eczema    Migraines     Past Surgical History:  Procedure Laterality Date   CESAREAN SECTION  1999   TONSILLECTOMY AND ADENOIDECTOMY  1991    There were no vitals filed for this visit.   Subjective Assessment - 11/17/19 0747    Subjective Pt reports 3/10 NPS in L hip and lower back. Pt reports a long drive over the weekend and also doing an increased amount of walking through uneven surface (grass/fields).    Pertinent History Pt. known to PT clinic.  Pt. has h/o low back pain over past several years but reports improvement with rest/ modalities.  Pt. states the back pain is different this time and started over the past 6 months.  Her current flare consists of pain across the lower back and into the anterior thighs, L>R. She reports numbness. Some difficulty lifting the left leg as compared to the right.    Limitations Lifting    Patient Stated Goals get back to normal    Currently in Pain? Yes    Pain Score 3     Pain Location Back    Pain Orientation Left    Pain Descriptors / Indicators Aching;Discomfort    Pain Type Acute pain    Pain Onset In the past 7 days    Multiple Pain Sites No           There.ex:   TrA activation with marches: 2x15   TrA activation with alternating overhead arm raises in supine: 1x15 bilaterally TrA activation dead bugs: 2x15, pt required min tactile cues and no verbal cues for maintaining active core.  Side-lying clamshells: 2x15 reps bilaterally   Manual Therapy:  In supine- L piriformis and hamstring stretch, 2x30 sec holds  In prone- L quad stretch, 2x30 sec  4 min total of stretches  In prone- STM along bilateral lumbar paraspinals superior to incision and along L lower lumbar quadrant along QL and lateral paraspinals. 11 min total   PT Long Term Goals - 11/03/19 1134      PT LONG TERM GOAL #1   Title Pt. will increase FOTO to 52 to improve pain-free functional mobiltiy.    Baseline initial FOTO: 36    Time 6    Period Weeks    Status New    Target Date 12/15/19      PT LONG TERM GOAL #2   Title Pt. will report less than a 5/10 on NPS after long work days to improve pain free function.    Baseline Pt. currently reports  5/10 pain on NPS after long work days    Time 6    Period Weeks    Status New    Target Date 12/15/19      PT LONG TERM GOAL #3   Title Pt. will decrease 5XSTS time to 15 seconds and without pain to improve pain free mobility.    Baseline Initial 5XSTS: 18.34 seconds    Time 4    Period Weeks    Status New    Target Date 12/15/19                 Plan - 11/17/19 0823    Clinical Impression Statement Pt performed TrA activation exercises with improvement in coordination with deadbugs and requiring less tactile cues to ensure core is activated. Due to an active weekend and increase in pain, manual therapy performed with a decrease in pain 3/10 NPS to a 1/10 NPS. Pt can continue to benefit from skilled PT to progress core stength.    Stability/Clinical Decision Making Evolving/Moderate complexity    Rehab Potential Good    PT Frequency 1x / week    PT Duration 6 weeks    PT Treatment/Interventions  ADLs/Self Care Home Management;Aquatic Therapy;Cryotherapy;Electrical Stimulation;Functional mobility training;Therapeutic activities;Therapeutic exercise;Balance training;Neuromuscular re-education;Manual techniques;Passive range of motion;Scar mobilization;Stair training    PT Next Visit Plan Progress TrA exercises    PT Home Exercise Plan Added bridges, side-lying clam shells, resisted Red TB side stepping, and paloff press with red TB    Consulted and Agree with Plan of Care Patient           Patient will benefit from skilled therapeutic intervention in order to improve the following deficits and impairments:  Decreased activity tolerance, Decreased endurance, Decreased mobility, Decreased range of motion, Impaired sensation, Increased muscle spasms, Impaired perceived functional ability, Postural dysfunction  Visit Diagnosis: S/P lumbar discectomy  Muscle weakness (generalized)  Joint stiffness  Acute bilateral low back pain with bilateral sciatica     Problem List Patient Active Problem List   Diagnosis Date Noted   Anxiety 01/22/2018   Vitamin D deficiency 01/22/2018   Prediabetes 01/22/2018   Epigastric abdominal pain 07/24/2016   Premature contractions, cardiac 03/04/2016   Chronic fatigue 09/28/2015   Concentration deficit 09/28/2015   Cough 05/05/2012   Tobacco abuse 05/05/2012   Obesity (BMI 30-39.9) 05/05/2012   Migraines 05/05/2012   Cammie Mcgee, PT, DPT # [id number], SPT 11/17/2019, 1:58 PM  [place] [facility] [address] [place], [place], [address] Phone: [phone]   Fax:  [phone]  Name: Kristen Carey MRN: [id number] Date of Birth: Jan 01, 1981

## 2019-11-24 ENCOUNTER — Other Ambulatory Visit: Payer: Self-pay

## 2019-11-24 ENCOUNTER — Encounter: Payer: Self-pay | Admitting: Physical Therapy

## 2019-11-24 ENCOUNTER — Ambulatory Visit: Payer: BC Managed Care – PPO | Attending: Physician Assistant | Admitting: Physical Therapy

## 2019-11-24 DIAGNOSIS — M5441 Lumbago with sciatica, right side: Secondary | ICD-10-CM | POA: Insufficient documentation

## 2019-11-24 DIAGNOSIS — Z9889 Other specified postprocedural states: Secondary | ICD-10-CM | POA: Insufficient documentation

## 2019-11-24 DIAGNOSIS — M5442 Lumbago with sciatica, left side: Secondary | ICD-10-CM | POA: Diagnosis not present

## 2019-11-24 DIAGNOSIS — M2560 Stiffness of unspecified joint, not elsewhere classified: Secondary | ICD-10-CM | POA: Insufficient documentation

## 2019-11-24 DIAGNOSIS — M6281 Muscle weakness (generalized): Secondary | ICD-10-CM | POA: Insufficient documentation

## 2019-11-24 NOTE — Therapy (Signed)
[place] [facility] [address] [place], [place], [address] Phone: [phone]   Fax:  [phone]  Physical Therapy Treatment  Patient Details  Name: Kristen Carey MRN: [id number] Date of Birth: Apr 06, 1981 Referring Provider (PT): Loleta Chance, MD   Encounter Date: 11/24/2019   PT End of Session - 11/24/19 0811    Visit Number 4    Number of Visits 6    Date for PT Re-Evaluation 12/01/19    Authorization - Visit Number 4    Authorization - Number of Visits 10    PT Start Time 0725    PT Stop Time 0808    PT Time Calculation (min) 43 min    Activity Tolerance Patient tolerated treatment well    Behavior During Therapy [facility] for tasks assessed/performed           Past Medical History:  Diagnosis Date  . Anxiety   . Eczema   . Migraines     Past Surgical History:  Procedure Laterality Date  . CESAREAN SECTION  1999  . TONSILLECTOMY AND ADENOIDECTOMY  1991    There were no vitals filed for this visit.   Subjective Assessment - 11/24/19 0808    Subjective Pt reports bending forward over the weekend to pick up a bag of ice and believe she pulled a muscle in her back or concerned she "slipped a disc". Pt reports a catchy, achy pain and is very guarded. Pt denies any N/T and radicular symptoms down either LE.    Pertinent History Pt. known to PT clinic.  Pt. has h/o low back pain over past several years but reports improvement with rest/ modalities.  Pt. states the back pain is different this time and started over the past 6 months.  Her current flare consists of pain across the lower back and into the anterior thighs, L>R. She reports numbness. Some difficulty lifting the left leg as compared to the right.    Limitations Lifting    Diagnostic tests see MRI/ imaging    Patient Stated Goals get back to normal    Currently in Pain? Yes    Pain Score 5     Pain Location Back    Pain Orientation Right;Left;Lower;Medial    Pain  Descriptors / Indicators Aching;Guarding;Grimacing    Pain Type Acute pain    Pain Onset In the past 7 days    Multiple Pain Sites No          Manual therapy:   With pt in prone, STM along B lower back from L5-T10 along paraspinals and B quadratus lumborum. Prior to manual therapy pt reported 5/10 NPS pain near healed incision, superiorly to incision and R sided Low back. After STM no improvements in pain levels and still reports 5/10 pain via NPS. Pt attempted light prone press-ups on elbows with increased "catching pain" on R low back. Discontinued due to increased pain after 3 reps.     PT Long Term Goals - 11/03/19 1134      PT LONG TERM GOAL #1   Title Pt. will increase FOTO to 52 to improve pain-free functional mobiltiy.    Baseline initial FOTO: 36    Time 6    Period Weeks    Status New    Target Date 12/15/19      PT LONG TERM GOAL #2   Title Pt. will report less than a 5/10 on NPS after long work days to improve pain free  function.    Baseline Pt. currently reports 5/10 pain on NPS after long work days    Time 6    Period Weeks    Status New    Target Date 12/15/19      PT LONG TERM GOAL #3   Title Pt. will decrease 5XSTS time to 15 seconds and without pain to improve pain free mobility.    Baseline Initial 5XSTS: 18.34 seconds    Time 4    Period Weeks    Status New    Target Date 12/15/19                 Plan - 11/24/19 2330    Clinical Impression Statement Pt displayed difficulty with all movements such as sitting and standing and transitioning from supine to prone. Pt is very guarded with all movements. Pt presented with constant 5/10 pain via NPS. STM performed to B low back from L5-T10 with no improvements in LBP. Pt educated to monitor her symptoms over the next 24-48 hours and if no improvement in pain to contact her doctor.    Stability/Clinical Decision Making Evolving/Moderate complexity    Rehab Potential Good    PT Frequency 1x / week    PT  Duration 6 weeks    PT Treatment/Interventions ADLs/Self Care Home Management;Aquatic Therapy;Cryotherapy;Electrical Stimulation;Functional mobility training;Therapeutic activities;Therapeutic exercise;Balance training;Neuromuscular re-education;Manual techniques;Passive range of motion;Scar mobilization;Stair training    PT Next Visit Plan Progress TrA exercises    PT Home Exercise Plan Added bridges, side-lying clam shells, resisted Red TB side stepping, and paloff press with red TB    Consulted and Agree with Plan of Care Patient           Patient will benefit from skilled therapeutic intervention in order to improve the following deficits and impairments:  Decreased activity tolerance, Decreased endurance, Decreased mobility, Decreased range of motion, Impaired sensation, Increased muscle spasms, Impaired perceived functional ability, Postural dysfunction  Visit Diagnosis: S/P lumbar discectomy  Muscle weakness (generalized)  Joint stiffness  Acute bilateral low back pain with bilateral sciatica     Problem List Patient Active Problem List   Diagnosis Date Noted  . Anxiety 01/22/2018  . Vitamin D deficiency 01/22/2018  . Prediabetes 01/22/2018  . Epigastric abdominal pain 07/24/2016  . Premature contractions, cardiac 03/04/2016  . Chronic fatigue 09/28/2015  . Concentration deficit 09/28/2015  . Cough 05/05/2012  . Tobacco abuse 05/05/2012  . Obesity (BMI 30-39.9) 05/05/2012  . Migraines 05/05/2012   Cammie Mcgee, PT, DPT # [id number] SPT 11/24/2019, 12:44 PM  [place] [facility] [address] [place], [place], [address] Phone: [phone]   Fax:  [phone]  Name: Tracie Dore Derryberry MRN: [id number] Date of Birth: May 05, 1981

## 2019-12-01 ENCOUNTER — Ambulatory Visit: Payer: BC Managed Care – PPO | Admitting: Physical Therapy

## 2019-12-01 ENCOUNTER — Other Ambulatory Visit: Payer: Self-pay

## 2019-12-01 ENCOUNTER — Encounter: Payer: Self-pay | Admitting: Physical Therapy

## 2019-12-01 DIAGNOSIS — Z9889 Other specified postprocedural states: Secondary | ICD-10-CM | POA: Diagnosis not present

## 2019-12-01 DIAGNOSIS — M6281 Muscle weakness (generalized): Secondary | ICD-10-CM

## 2019-12-01 DIAGNOSIS — M5441 Lumbago with sciatica, right side: Secondary | ICD-10-CM | POA: Diagnosis not present

## 2019-12-01 DIAGNOSIS — M5442 Lumbago with sciatica, left side: Secondary | ICD-10-CM | POA: Diagnosis not present

## 2019-12-01 DIAGNOSIS — M2560 Stiffness of unspecified joint, not elsewhere classified: Secondary | ICD-10-CM | POA: Diagnosis not present

## 2019-12-01 NOTE — Therapy (Signed)
[place] [facility] [address] [place], [place], [address] Phone: [phone]   Fax:  [phone]  Physical Therapy Treatment  Patient Details  Name: Kristen Carey MRN: [id number] Date of Birth: 1980-08-27 Referring Provider (PT): Loleta Chance, MD   Encounter Date: 12/01/2019   PT End of Session - 12/01/19 0817    Visit Number 5    Number of Visits 6    Date for PT Re-Evaluation 12/01/19    Authorization - Visit Number 5    Authorization - Number of Visits 10    PT Start Time 0736    PT Stop Time 0816    PT Time Calculation (min) 40 min    Activity Tolerance Patient tolerated treatment well    Behavior During Therapy [facility] for tasks assessed/performed           Past Medical History:  Diagnosis Date  . Anxiety   . Eczema   . Migraines     Past Surgical History:  Procedure Laterality Date  . CESAREAN SECTION  1999  . TONSILLECTOMY AND ADENOIDECTOMY  1991    There were no vitals filed for this visit.   Subjective Assessment - 12/01/19 0816    Subjective Pt reports she had improvements in pain after previous session. Pt states her pain has improved from her low back and reports it is only a 1/10 NPS.    Pertinent History Pt. known to PT clinic.  Pt. has h/o low back pain over past several years but reports improvement with rest/ modalities.  Pt. states the back pain is different this time and started over the past 6 months.  Her current flare consists of pain across the lower back and into the anterior thighs, L>R. She reports numbness. Some difficulty lifting the left leg as compared to the right.    Limitations Lifting    Diagnostic tests see MRI/ imaging    Patient Stated Goals get back to normal    Currently in Pain? Yes    Pain Score 1     Pain Location Back    Pain Orientation Right;Left    Pain Onset In the past 7 days          There.ex:   Supine TrA activation with marches: 1x20 Supine dead bugs:  2x10  Quadraped: TrA activation with arm reaches 2x10, TrA activation with shoulder taps 2x10, TrA activation hip abduction B 1x10. Cone on sacrum as tactile cue to maintain neutral spine and pelvis.  Standing B hip abduction: 2x12, 4 lbs ankle weights Standing B hip extension: 1x12, 4 lbs ankle weights Squat with airex pad in chair practicing hip hinge: 1x12 STS with no airex pad in seat: 1x15 Resisted red theraband walk-outs: B 1x5 with 3 steps/side  Resisted red theraband walk-outs with palof press: B 1x5 with 3 steps and 5 presses/side   PT Long Term Goals - 11/03/19 1134      PT LONG TERM GOAL #1   Title Pt. will increase FOTO to 52 to improve pain-free functional mobiltiy.    Baseline initial FOTO: 36    Time 6    Period Weeks    Status New    Target Date 12/15/19      PT LONG TERM GOAL #2   Title Pt. will report less than a 5/10 on NPS after long work days to improve pain free function.    Baseline Pt. currently reports 5/10 pain on NPS after long work days  Time 6    Period Weeks    Status New    Target Date 12/15/19      PT LONG TERM GOAL #3   Title Pt. will decrease 5XSTS time to 15 seconds and without pain to improve pain free mobility.    Baseline Initial 5XSTS: 18.34 seconds    Time 4    Period Weeks    Status New    Target Date 12/15/19                 Plan - 12/01/19 0818    Clinical Impression Statement Pt was not limited by back pain like previous session. Pt progressed core exercises to quadreped with use of cone on sacrum as tactile cue to maintain level pelvis with shoulder taps and arm reaches. Pt progressed anti-rotation walk outs with red theraband with paloff press with good form/technique and educated on propr lifting and squatting mechanics with use of arm chair as tactile cue to utilize hip hinging to prevent bending of spine to maintain BLT precautions. Pt can continue to benefit from skilled PT treatment to progress core strength and  reinforce proper lifting and squatting techniques to prevent low back injury.    Stability/Clinical Decision Making Evolving/Moderate complexity    Rehab Potential Good    PT Frequency 1x / week    PT Duration 6 weeks    PT Treatment/Interventions ADLs/Self Care Home Management;Aquatic Therapy;Cryotherapy;Electrical Stimulation;Functional mobility training;Therapeutic activities;Therapeutic exercise;Balance training;Neuromuscular re-education;Manual techniques;Passive range of motion;Scar mobilization;Stair training    PT Next Visit Plan Quadreped core, bird dogs, LE strength, hip hinge and squats    PT Home Exercise Plan Added bridges, side-lying clam shells, resisted Red TB side stepping, and paloff press with red TB    Consulted and Agree with Plan of Care Patient           Patient will benefit from skilled therapeutic intervention in order to improve the following deficits and impairments:  Decreased activity tolerance, Decreased endurance, Decreased mobility, Decreased range of motion, Impaired sensation, Increased muscle spasms, Impaired perceived functional ability, Postural dysfunction  Visit Diagnosis: S/P lumbar discectomy  Muscle weakness (generalized)  Joint stiffness  Acute bilateral low back pain with bilateral sciatica     Problem List Patient Active Problem List   Diagnosis Date Noted  . Anxiety 01/22/2018  . Vitamin D deficiency 01/22/2018  . Prediabetes 01/22/2018  . Epigastric abdominal pain 07/24/2016  . Premature contractions, cardiac 03/04/2016  . Chronic fatigue 09/28/2015  . Concentration deficit 09/28/2015  . Cough 05/05/2012  . Tobacco abuse 05/05/2012  . Obesity (BMI 30-39.9) 05/05/2012  . Migraines 05/05/2012   Cammie Mcgee, PT, DPT # [id number] SPT 12/01/2019, 2:52 PM   [facility] [address] [place], [place], [address] Phone: [phone]   Fax:  [phone]  Name: Kristen Sipe  Carey MRN: [id number] Date of Birth: 12/25/1980

## 2019-12-08 ENCOUNTER — Other Ambulatory Visit: Payer: Self-pay

## 2019-12-08 ENCOUNTER — Ambulatory Visit: Payer: BC Managed Care – PPO | Admitting: Physical Therapy

## 2019-12-08 ENCOUNTER — Encounter: Payer: Self-pay | Admitting: Physical Therapy

## 2019-12-08 DIAGNOSIS — M5442 Lumbago with sciatica, left side: Secondary | ICD-10-CM | POA: Diagnosis not present

## 2019-12-08 DIAGNOSIS — M6281 Muscle weakness (generalized): Secondary | ICD-10-CM | POA: Diagnosis not present

## 2019-12-08 DIAGNOSIS — M2560 Stiffness of unspecified joint, not elsewhere classified: Secondary | ICD-10-CM

## 2019-12-08 DIAGNOSIS — Z9889 Other specified postprocedural states: Secondary | ICD-10-CM | POA: Diagnosis not present

## 2019-12-08 DIAGNOSIS — M5441 Lumbago with sciatica, right side: Secondary | ICD-10-CM | POA: Diagnosis not present

## 2019-12-08 NOTE — Therapy (Signed)
[facility] [address] [place], [place], [address] Phone: [phone]   Fax:  [phone]  Physical Therapy Treatment/Discharge Summary  Patient Details  Name: Kristen Carey MRN: [id number] Date of Birth: 1981/03/23 Referring Provider (PT): Loleta Chance, MD   Encounter Date: 12/08/2019   PT End of Session - 12/08/19 0948    Visit Number 6    Number of Visits 6    Date for PT Re-Evaluation 12/09/19    Authorization - Visit Number 1    Authorization - Number of Visits 10    PT Start Time 0747    PT Stop Time 0830    PT Time Calculation (min) 43 min    Activity Tolerance Patient tolerated treatment well;No increased pain    Behavior During Therapy WFL for tasks assessed/performed           Past Medical History:  Diagnosis Date  . Anxiety   . Eczema   . Migraines     Past Surgical History:  Procedure Laterality Date  . CESAREAN SECTION  1999  . TONSILLECTOMY AND ADENOIDECTOMY  1991    There were no vitals filed for this visit.   Subjective Assessment - 12/08/19 0946    Subjective Pt reports no pain and has been following her BLT precautions.  Pt. understands exercise program and importance of core muscle activation.    Pertinent History Pt. known to PT clinic.  Pt. has h/o low back pain over past several years but reports improvement with rest/ modalities.  Pt. states the back pain is different this time and started over the past 6 months.  Her current flare consists of pain across the lower back and into the anterior thighs, L>R. She reports numbness. Some difficulty lifting the left leg as compared to the right.    Limitations Lifting    Diagnostic tests see MRI/ imaging    Patient Stated Goals get back to normal    Currently in Pain? No/denies    Pain Score 0-No pain    Pain Onset In the past 7 days            Pt's goals reassessed. Pt accomplished all goals: No pain after long work day, improved FOTO  score to 55 with a target score of 52, and improved 5xSTS score to 12.2 seconds with a goal of performing in 15 seconds.  There.ex:   Supine deadbugs: 1x20   Quadreped bird dogs with PVC pipe on pelvis as tactile cue for proper form: 2x12  Proper squat and lifting mechanics education and demonstration for prevention of future injury: 5 min. Noticed heel rise in deep squat indicating tight gastroc/soleus complex  Supine and standing gastroc and soleus stretches: Bilateral 2x30 sec for R/L LE's  Standing anti-rotation step-outs with red theraband and paloff presses discussed and assessed technique and form. 1x5 with 3 steps.  Educated pt on HEP packet of progressive core and BLE exercises to perform at home.        PT Education - 12/08/19 0947    Education Details Progressive HEP for core and BLE strength for discharge    Person(s) Educated Patient    Methods Explanation;Demonstration;Tactile cues;Handout    Comprehension Verbalized understanding;Returned demonstration               PT Long Term Goals - 12/08/19 0955      PT LONG TERM GOAL #1   Title Pt. will increase FOTO to 52 to improve pain-free  functional mobiltiy.    Baseline initial FOTO: 36; 7/21: 55    Time 0    Period Weeks    Status Achieved    Target Date 12/08/19      PT LONG TERM GOAL #2   Title Pt. will report less than a 5/10 on NPS after long work days to improve pain free function.    Baseline Pt. currently reports 5/10 pain on NPS after long work days; 7/21: pt reports no pain after long work day. Some reports of muscle spasms and discomfort intermittently after some long days.    Time 0    Period Weeks    Status Achieved    Target Date 12/08/19      PT LONG TERM GOAL #3   Title Pt. will decrease 5XSTS time to 15 seconds and without pain to improve pain free mobility.    Baseline Initial 5XSTS: 18.34 seconds; 7/21: 12.2 seconds    Time 0    Period Weeks    Status Achieved    Target Date 12/08/19                Plan - 12/08/19 0949    Clinical Impression Statement Goals were reassessed prior to treatment. Pt accomplished FOTO goal scoring a 55 with a target of 52. Pt reports no back pain after a long day's work with a goal of back pain less than a 5/10 NPS. Pt also accomplished 5xSTS goal demonstating improvements in BLE strength and demonstrated with good form requiring no verbal or tactile cues and no pain throughout motion. Pt performed in 12.2 sec with a goal of completing in 15 seconds. Pt given progressive core and BLE strength exercises and educated on proper squat and lifting techniques for prevention of future injury. Pt demonstrated proper technique and form with exercises with minimal verbal cueing required from therapist. Pt given handout of exercises and pt had no further questions. Pt told to give PT a call if she has any questions or concerns.    Stability/Clinical Decision Making Evolving/Moderate complexity    Clinical Decision Making Moderate    Rehab Potential Good    PT Frequency 1x / week    PT Duration 6 weeks    PT Treatment/Interventions ADLs/Self Care Home Management;Aquatic Therapy;Cryotherapy;Electrical Stimulation;Functional mobility training;Therapeutic activities;Therapeutic exercise;Balance training;Neuromuscular re-education;Manual techniques;Passive range of motion;Scar mobilization;Stair training    PT Next Visit Plan Quadruped core, bird dogs, LE strength, hip hinge and squats    PT Home Exercise Plan Added bridges, side-lying clam shells, resisted Red TB side stepping, and paloff press with red TB    Consulted and Agree with Plan of Care Patient           Patient will benefit from skilled therapeutic intervention in order to improve the following deficits and impairments:  Decreased activity tolerance, Decreased endurance, Decreased mobility, Decreased range of motion, Impaired sensation, Increased muscle spasms, Impaired perceived functional ability,  Postural dysfunction  Visit Diagnosis: S/P lumbar discectomy  Muscle weakness (generalized)  Joint stiffness  Acute bilateral low back pain with bilateral sciatica     Problem List Patient Active Problem List   Diagnosis Date Noted  . Anxiety 01/22/2018  . Vitamin D deficiency 01/22/2018  . Prediabetes 01/22/2018  . Epigastric abdominal pain 07/24/2016  . Premature contractions, cardiac 03/04/2016  . Chronic fatigue 09/28/2015  . Concentration deficit 09/28/2015  . Cough 05/05/2012  . Tobacco abuse 05/05/2012  . Obesity (BMI 30-39.9) 05/05/2012  . Migraines 05/05/2012   Casimiro Needle  Fuller Song, PT, DPT # [id number] Ronnie Derby, SPT 12/08/2019, 2:08 PM  [facility] [address] [place], [place], [address] Phone: [phone]   Fax:  [phone]  Name: Tamie Minteer Navis MRN: [id number] Date of Birth: 1980/08/31

## 2019-12-08 NOTE — Patient Instructions (Signed)
Access Code: [email or web address]: [email or web address]: 07/21/2021Prepared by: Casimiro Needle SherkExercises  Standing Gastroc Stretch - 1 x daily - 7 x weekly - 1 sets - 3 reps - 20 seconds hold  Standing Soleus Stretch on Step - 1 x daily - 7 x weekly - 1 sets - 3 reps - 20 seconds hold  Dead Bug - 1 x daily - 4 x weekly - 3 sets - 10 reps  Bird Dog - 1 x daily - 4 x weekly - 3 sets - 10 reps  Anti-Rotation Lateral Stepping with Press - 1 x daily - 4 x weekly - 3 sets - 10 reps  Standing Anti-Rotation Press with Anchored Resistance - 1 x daily - 4 x weekly - 3 sets - 10 reps  Side Stepping with Resistance at Thighs - 1 x daily - 4 x weekly - 3 sets - 10 reps

## 2019-12-23 ENCOUNTER — Other Ambulatory Visit: Payer: Self-pay | Admitting: Obstetrics and Gynecology

## 2019-12-23 DIAGNOSIS — Z1231 Encounter for screening mammogram for malignant neoplasm of breast: Secondary | ICD-10-CM

## 2019-12-31 DIAGNOSIS — Z9889 Other specified postprocedural states: Secondary | ICD-10-CM | POA: Diagnosis not present

## 2020-01-05 ENCOUNTER — Other Ambulatory Visit: Payer: Self-pay

## 2020-01-05 ENCOUNTER — Ambulatory Visit
Admission: RE | Admit: 2020-01-05 | Discharge: 2020-01-05 | Disposition: A | Discharge status: Home / Self Care | Payer: BC Managed Care – PPO | Source: Ambulatory Visit | Attending: Obstetrics and Gynecology | Admitting: Obstetrics and Gynecology

## 2020-01-05 DIAGNOSIS — Z1231 Encounter for screening mammogram for malignant neoplasm of breast: Secondary | ICD-10-CM | POA: Insufficient documentation

## 2020-01-12 ENCOUNTER — Other Ambulatory Visit: Payer: Self-pay | Admitting: Obstetrics and Gynecology

## 2020-01-12 DIAGNOSIS — N6459 Other signs and symptoms in breast: Secondary | ICD-10-CM

## 2020-01-12 DIAGNOSIS — N6489 Other specified disorders of breast: Secondary | ICD-10-CM

## 2020-01-18 ENCOUNTER — Ambulatory Visit
Admission: RE | Admit: 2020-01-18 | Discharge: 2020-01-18 | Disposition: A | Discharge status: Home / Self Care | Payer: BC Managed Care – PPO | Source: Ambulatory Visit | Attending: Obstetrics and Gynecology | Admitting: Obstetrics and Gynecology

## 2020-01-18 ENCOUNTER — Other Ambulatory Visit: Payer: Self-pay

## 2020-01-18 DIAGNOSIS — N6489 Other specified disorders of breast: Secondary | ICD-10-CM | POA: Diagnosis not present

## 2020-01-18 DIAGNOSIS — N6459 Other signs and symptoms in breast: Secondary | ICD-10-CM | POA: Insufficient documentation

## 2020-01-18 DIAGNOSIS — R928 Other abnormal and inconclusive findings on diagnostic imaging of breast: Secondary | ICD-10-CM | POA: Diagnosis not present

## 2020-01-18 DIAGNOSIS — N6001 Solitary cyst of right breast: Secondary | ICD-10-CM | POA: Diagnosis not present

## 2020-01-20 ENCOUNTER — Other Ambulatory Visit: Payer: Self-pay | Admitting: Obstetrics and Gynecology

## 2020-01-20 DIAGNOSIS — R928 Other abnormal and inconclusive findings on diagnostic imaging of breast: Secondary | ICD-10-CM

## 2020-01-20 DIAGNOSIS — N6489 Other specified disorders of breast: Secondary | ICD-10-CM

## 2020-07-14 ENCOUNTER — Telehealth: Payer: Self-pay | Admitting: Internal Medicine

## 2020-07-14 NOTE — Telephone Encounter (Signed)
Patient called in wanted to transfer to [facility] patient has not been since 2019

## 2020-07-14 NOTE — Telephone Encounter (Signed)
Dr French Ana McLean-Scocuzza is okay with this transfer if [facility] is accepting new patients   Please advise

## 2020-07-17 NOTE — Telephone Encounter (Signed)
Okay ill accept transfer

## 2020-07-17 NOTE — Telephone Encounter (Signed)
Would you mind getting patient scheduled with Claris Che? Please & thank you!:))

## 2020-08-03 ENCOUNTER — Other Ambulatory Visit: Payer: Self-pay | Admitting: Obstetrics and Gynecology

## 2020-08-03 ENCOUNTER — Other Ambulatory Visit: Payer: Self-pay

## 2020-08-03 ENCOUNTER — Ambulatory Visit
Admission: RE | Admit: 2020-08-03 | Discharge: 2020-08-03 | Disposition: A | Discharge status: Home / Self Care | Payer: BC Managed Care – PPO | Source: Ambulatory Visit | Attending: Obstetrics and Gynecology | Admitting: Obstetrics and Gynecology

## 2020-08-03 DIAGNOSIS — R928 Other abnormal and inconclusive findings on diagnostic imaging of breast: Secondary | ICD-10-CM

## 2020-08-03 DIAGNOSIS — N6489 Other specified disorders of breast: Secondary | ICD-10-CM

## 2020-08-03 DIAGNOSIS — R922 Inconclusive mammogram: Secondary | ICD-10-CM | POA: Diagnosis not present

## 2020-08-17 DIAGNOSIS — L578 Other skin changes due to chronic exposure to nonionizing radiation: Secondary | ICD-10-CM | POA: Diagnosis not present

## 2020-08-17 DIAGNOSIS — D229 Melanocytic nevi, unspecified: Secondary | ICD-10-CM | POA: Diagnosis not present

## 2020-08-17 DIAGNOSIS — D1801 Hemangioma of skin and subcutaneous tissue: Secondary | ICD-10-CM | POA: Diagnosis not present

## 2020-08-17 DIAGNOSIS — L738 Other specified follicular disorders: Secondary | ICD-10-CM | POA: Diagnosis not present

## 2020-08-21 ENCOUNTER — Other Ambulatory Visit: Payer: Self-pay

## 2020-08-21 ENCOUNTER — Ambulatory Visit: Payer: BC Managed Care – PPO | Admitting: Family

## 2020-08-21 ENCOUNTER — Encounter: Payer: Self-pay | Admitting: Family

## 2020-08-21 VITALS — BP 118/78 | HR 67 | Temp 98.0°F | Ht 66.0 in | Wt 213.6 lb

## 2020-08-21 DIAGNOSIS — G43109 Migraine with aura, not intractable, without status migrainosus: Secondary | ICD-10-CM

## 2020-08-21 DIAGNOSIS — R5382 Chronic fatigue, unspecified: Secondary | ICD-10-CM

## 2020-08-21 DIAGNOSIS — E669 Obesity, unspecified: Secondary | ICD-10-CM | POA: Diagnosis not present

## 2020-08-21 LAB — COMPREHENSIVE METABOLIC PANEL
ALT: 24 U/L (ref 0–35)
AST: 19 U/L (ref 0–37)
Albumin: 4.1 g/dL (ref 3.5–5.2)
Alkaline Phosphatase: 70 U/L (ref 39–117)
BUN: 10 mg/dL (ref 6–23)
CO2: 24 mEq/L (ref 19–32)
Calcium: 9.1 mg/dL (ref 8.4–10.5)
Chloride: 104 mEq/L (ref 96–112)
Creatinine, Ser: 0.59 mg/dL (ref 0.40–1.20)
GFR: 112.96 mL/min (ref >60.00)
Glucose, Bld: 85 mg/dL (ref 70–99)
Potassium: 4.1 mEq/L (ref 3.5–5.1)
Sodium: 137 mEq/L (ref 135–145)
Total Bilirubin: 0.5 mg/dL (ref 0.2–1.2)
Total Protein: 6.6 g/dL (ref 6.0–8.3)

## 2020-08-21 LAB — CBC WITH DIFFERENTIAL/PLATELET
Basophils Absolute: 0.1 10*3/uL (ref 0.0–0.1)
Basophils Relative: 0.7 % (ref 0.0–3.0)
Eosinophils Absolute: 0.1 10*3/uL (ref 0.0–0.7)
Eosinophils Relative: 1 % (ref 0.0–5.0)
HCT: 42 % (ref 36.0–46.0)
Hemoglobin: 14 g/dL (ref 12.0–15.0)
Lymphocytes Relative: 23.7 % (ref 12.0–46.0)
Lymphs Abs: 2 10*3/uL (ref 0.7–4.0)
MCHC: 33.4 g/dL (ref 30.0–36.0)
MCV: 94.1 fl (ref 78.0–100.0)
Monocytes Absolute: 0.5 10*3/uL (ref 0.1–1.0)
Monocytes Relative: 5.4 % (ref 3.0–12.0)
Neutro Abs: 5.9 10*3/uL (ref 1.4–7.7)
Neutrophils Relative %: 69.2 % (ref 43.0–77.0)
Platelets: 210 10*3/uL (ref 150.0–400.0)
RBC: 4.46 Mil/uL (ref 3.87–5.11)
RDW: 12.8 % (ref 11.5–15.5)
WBC: 8.5 10*3/uL (ref 4.0–10.5)

## 2020-08-21 LAB — HEMOGLOBIN A1C: Hgb A1c MFr Bld: 5.8 % (ref 4.6–6.5)

## 2020-08-21 LAB — LIPID PANEL
Cholesterol: 225 mg/dL — ABNORMAL HIGH (ref 0–200)
HDL: 41.4 mg/dL (ref >39.00)
LDL Cholesterol: 155 mg/dL — ABNORMAL HIGH (ref 0–99)
NonHDL: 183.91
Total CHOL/HDL Ratio: 5
Triglycerides: 147 mg/dL (ref 0.0–149.0)
VLDL: 29.4 mg/dL (ref 0.0–40.0)

## 2020-08-21 LAB — TSH: TSH: 1.77 u[IU]/mL (ref 0.35–4.50)

## 2020-08-21 LAB — VITAMIN D 25 HYDROXY (VIT D DEFICIENCY, FRACTURES): VITD: 26.78 ng/mL — ABNORMAL LOW (ref 30.00–100.00)

## 2020-08-21 LAB — B12 AND FOLATE PANEL
Folate: 12.5 ng/mL (ref >5.9)
Vitamin B-12: 530 pg/mL (ref 211–911)

## 2020-08-21 MED ORDER — WEGOVY 0.25 MG/0.5ML ~~LOC~~ SOAJ: 0.2500 mg | SUBCUTANEOUS | 2 refills | Status: DC

## 2020-08-21 NOTE — Patient Instructions (Addendum)
Diagnostic bilateral mammogram in 6 months, 01/2021. Please call me in August so that I may place order for you.  We have discussed starting non insulin daily injectable medication called Wegovy  which is a glucagon like peptide (GLP 1) agonist and works by delaying gastric emptying and increasing insulin secretion.It is given once per week. Most patients see significant weight loss with this drug class.   You may NOT take either medication if you or your family has history of thyroid, parathyroid, OR adrenal cancer. Please confirm you and your family does NOT have this history as this drug class has black box warning on this medication for that reason.   Advise to follow with directions on prescription and slowly increase from 0.25mg  [place] once per week ;stay here for 4 weeks. You may then increase to 0.5mg  [place] once per week and stay there for 4 weeks.  We can slowly titrate further at follow up with goal of no more than 1-2 lbs weight loss per week.  This is  Dr. Melina Schools  example of a  "Low GI"  Diet:  It will allow you to lose 4 to 8  lbs  per month if you follow it carefully.  Your goal with exercise is a minimum of 30 minutes of aerobic exercise 5 days per week (Walking does not count once it becomes easy!)    All of the foods can be found at grocery stores and in bulk at [employer].  The Atkins protein bars and shakes are available in more varieties at Target, WalMart and Lowe's Foods.     7 AM Breakfast:  Choose from the following:  Low carbohydrate Protein  Shakes (I recommend the  Premier Protein chocolate shakes,  EAS AdvantEdge "Carb Control" shakes  Or the Atkins shakes all are under 3 net carbs)     a scrambled egg/bacon/cheese burrito made with Mission's "carb balance" whole wheat tortilla  (about 10 net carbs )  Medical laboratory scientific officer (basically a quiche without the pastry crust) that is eaten cold and very convenient way to get your eggs.  8 carbs)  If you make your own  protein shakes, avoid bananas and pineapple,  And use low carb greek yogurt or original /unsweetened almond or soy milk    Avoid cereal and bananas, oatmeal and cream of wheat and grits. They are loaded with carbohydrates!   10 AM: high protein snack:  Protein bar by Atkins (the snack size, under 200 cal, usually < 6 net carbs).    A stick of cheese:  Around 1 carb,  100 cal     Dannon Light n Fit [place] Yogurt  (80 cal, 8 carbs)  Other so called "protein bars" and Greek yogurts tend to be loaded with carbohydrates.  Remember, in food advertising, the word "energy" is synonymous for " carbohydrate."  Lunch:   A Sandwich using the bread choices listed, Can use any  Eggs,  lunchmeat, grilled meat or canned tuna), avocado, regular mayo/mustard  and cheese.  A Salad using blue cheese, ranch,  Goddess or vinagrette,  Avoid taco shells, croutons or "confetti" and no "candied nuts" but regular nuts OK.   No pretzels, nabs  or chips.  Pickles and miniature sweet peppers are a good low carb alternative that provide a "crunch"  The bread is the only source of carbohydrate in a sandwich and  can be decreased by trying some of the attached alternatives to traditional loaf bread  Avoid "Low fat dressings, as well as Reyne Dumas and [facility] dressings They are loaded with sugar!   3 PM/ Mid day  Snack:  Consider  1 ounce of  almonds, walnuts, pistachios, pecans, peanuts,  Macadamia nuts or a nut medley.  Avoid "granola and granola bars "  Mixed nuts are ok in moderation as long as there are no raisins,  cranberries or dried fruit.   KIND bars are OK if you get the low glycemic index variety   Try the prosciutto/mozzarella cheese sticks by Fiorruci  In deli /backery section   High protein      6 PM  Dinner:     Meat/fowl/fish with a green salad, and either broccoli, cauliflower, green beans, spinach, brussel sprouts or  Lima beans. DO NOT BREAD THE PROTEIN!!      There is a low carb pasta by  Dreamfield's that is acceptable and tastes great: only 5 digestible carbs/serving.( All grocery stores but BJs carry it ) Several ready made meals are available low carb:   Try Michel Angelo's chicken piccata or chicken or eggplant parm over low carb pasta.(Lowes and BJs)   Clifton Custard Sanchez's "Carnitas" (pulled pork, no sauce,  0 carbs) or his beef pot roast to make a dinner burrito (at BJ's)  Pesto over low carb pasta (bj's sells a good quality pesto in the center refrigerated section of the deli   Try satueeing  Roosvelt Harps with mushroooms as a good side   Green Giant makes a mashed cauliflower that tastes like mashed potatoes  Whole wheat pasta is still full of digestible carbs and  Not as low in glycemic index as Dreamfield's.   Brown rice is still rice,  So skip the rice and noodles if you eat [place] or [place] (or at least limit to 1/2 cup)  9 PM snack :   Breyer's "low carb" fudgsicle or  ice cream bar (Carb Smart line), or  Weight Watcher's ice cream bar , or another "no sugar added" ice cream;  a serving of fresh berries/cherries with whipped cream   Cheese or DANNON'S LlGHT N FIT GREEK YOGURT  8 ounces of [place] unsweetened almond/cococunut milk    Treat yourself to a parfait made with whipped cream blueberiies, walnuts and vanilla greek yogurt  Avoid bananas, pineapple, grapes  and watermelon on a regular basis because they are high in sugar.  THINK OF THEM AS DESSERT  Remember that snack Substitutions should be less than 10 NET carbs per serving and meals < 20 carbs. Remember to subtract fiber grams to get the "net carbs."  @TULLOBREADPACKAGE @

## 2020-08-21 NOTE — Assessment & Plan Note (Signed)
Trial of wegovy. Discussed black box warning regarding thyroid cancer. Close follow up.

## 2020-08-21 NOTE — Progress Notes (Signed)
Subjective:    Patient ID: Kristen Carey, female    DOB: 05/22/1980, 40 y.o.   MRN: [id number]  CC: Azula Zappia Ordoyne is a 40 y.o. female who presents today to establish care.    HPI: She continues to complain of fatigue for 3 years, present daily, unchanged.  Sleeps 7-8 per night. She doesnt snore.  No depression. No si/hi  Frustrated by weight. She has been working out with weight training 30 minutes 3-4 times per week for a year. Twice per week intense aerobic training. No cp.   She tried wellbutrin in the past and very tearful, without weight loss.  No personal or family h/o thyroid cancer.   No fever, unusual weight loss.  Migraines- very infrequent. HAs feel similar to HA in the past. Weather is trigger such as rain, pressure change. No cva, atherosclerosis, cad. occasional aura with red and green spots in one visual field when eyes are closed. Takes half tablet imitrex with aleve with relief. No vision loss, confusion, numbness of face.    GYN- follows with Earlene Plater, NP   Right breast asymmetry Diagnostic bilateral mammogram in 6 months.  Smoker  HISTORY:  Past Medical History:  Diagnosis Date  . Anxiety   . Eczema   . Migraines    Past Surgical History:  Procedure Laterality Date  . CESAREAN SECTION  1999  . TONSILLECTOMY AND ADENOIDECTOMY  1991   Family History  Problem Relation Age of Onset  . Diabetes Mother   . Hypertension Mother   . Hyperlipidemia Mother   . Cancer Maternal Grandmother        lung  . Diabetes Maternal Grandmother   . Heart disease Maternal Grandmother   . Breast cancer Maternal Grandmother 93  . Diabetes Maternal Grandfather   . Heart disease Maternal Grandfather   . Alcohol abuse Maternal Grandfather   . Stroke Paternal Grandfather   . Colon cancer Neg Hx   . Thyroid cancer Neg Hx     Allergies: Penicillins and Chantix [varenicline] Current Outpatient Medications on File Prior to Visit  Medication Sig  Dispense Refill  . cyanocobalamin (,VITAMIN B-12,) 1000 MCG/ML injection Inject 1 mL (1,000 mcg total) into the muscle once. 1 mL 0  . levonorgestrel (MIRENA) 20 MCG/24HR IUD 1 each by Intrauterine route once.    . SUMAtriptan (IMITREX) 100 MG tablet Take 100 mg by mouth.     No current facility-administered medications on file prior to visit.    Social History   Tobacco Use  . Smoking status: Current Every Day Smoker    Packs/day: 1.00  . Smokeless tobacco: Never Used  Substance Use Topics  . Alcohol use: Yes    Alcohol/week: 0.0 standard drinks  . Drug use: No    Review of Systems  Constitutional: Positive for fatigue. Negative for chills and fever.  HENT: Negative for trouble swallowing.   Eyes: Negative for visual disturbance.  Respiratory: Negative for cough.   Cardiovascular: Negative for chest pain and palpitations.  Gastrointestinal: Negative for nausea and vomiting.  Neurological: Negative for dizziness and headaches (rare).  Psychiatric/Behavioral: Negative for sleep disturbance and suicidal ideas.      Objective:    BP 118/78   Pulse 67   Temp 98 F (36.7 C)   Ht 5\' 6"  (1.676 m)   Wt 213 lb 9.6 oz (96.9 kg)   SpO2 97%   BMI 34.48 kg/m  BP Readings from Last 3 Encounters:  08/21/20 118/78  06/26/18 126/80  01/22/18 132/84   Wt Readings from Last 3 Encounters:  08/21/20 213 lb 9.6 oz (96.9 kg)  06/26/18 232 lb 6.4 oz (105.4 kg)  01/22/18 233 lb 12.8 oz (106.1 kg)    Physical Exam Vitals reviewed.  Constitutional:      Appearance: She is well-developed.  Eyes:     Conjunctiva/sclera: Conjunctivae normal.  Neck:     Thyroid: No thyroid mass, thyromegaly or thyroid tenderness.  Cardiovascular:     Rate and Rhythm: Normal rate and regular rhythm.     Pulses: Normal pulses.     Heart sounds: Normal heart sounds.  Pulmonary:     Effort: Pulmonary effort is normal.     Breath sounds: Normal breath sounds. No wheezing, rhonchi or rales.  Skin:     General: Skin is warm and dry.  Neurological:     Mental Status: She is alert.  Psychiatric:        Speech: Speech normal.        Behavior: Behavior normal.        Thought Content: Thought content normal.        Assessment & Plan:   Problem List Items Addressed This Visit      Cardiovascular and Mediastinum   Migraines    Excellent control. HA similar to HAs in the past. BP well controlled. Discussed risk of smoking and CVA. Long discussion in regards to similar symptoms of HA and CVA and how to remain very vigilant as well as to report to the ED if any new or worsening features of HA.         Other   Chronic fatigue - Primary    For Years. Suspect multifactorial. Sleep is not restorative. Pending sleep study, baseline labs.       Relevant Orders   TSH   CBC with Differential/Platelet   Comprehensive metabolic panel   Hemoglobin A1c   Lipid panel   VITAMIN D 25 Hydroxy (Vit-D Deficiency, Fractures)   B12 and Folate Panel   Ambulatory referral to Pulmonology   Obesity (BMI 30-39.9)    Trial of wegovy. Discussed black box warning regarding thyroid cancer. Close follow up.       Relevant Medications   Semaglutide-Weight Management (WEGOVY) 0.25 MG/0.5ML SOAJ   Other Relevant Orders   TSH       I am having Lucile Shutters. Mankowski start on Wegovy. I am also having her maintain her levonorgestrel, SUMAtriptan, and cyanocobalamin.   Meds ordered this encounter  Medications  . Semaglutide-Weight Management (WEGOVY) 0.25 MG/0.5ML SOAJ    Sig: Inject 0.25 mg into the skin once a week.    Dispense:  2 mL    Refill:  2    Order Specific Question:   Supervising Provider    Answer:   Sherlene Shams [[id number]]    Return precautions given.   Risks, benefits, and alternatives of the medications and treatment plan prescribed today were discussed, and patient expressed understanding.   Education regarding symptom management and diagnosis given to patient on AVS.  Continue to  follow with McLean-Scocuzza, Pasty Spillers, MD for routine health maintenance.   Lucile Shutters Westry and I agreed with plan.   Rennie Plowman, FNP

## 2020-08-21 NOTE — Assessment & Plan Note (Addendum)
Excellent control. HA similar to HAs in the past. BP well controlled. Discussed risk of smoking and CVA. Long discussion in regards to similar symptoms of HA and CVA and how to remain very vigilant as well as to report to the ED if any new or worsening features of HA.

## 2020-08-21 NOTE — Assessment & Plan Note (Addendum)
For Years. Suspect multifactorial. Sleep is not restorative. Pending sleep study, baseline labs.

## 2020-08-23 ENCOUNTER — Encounter: Payer: Self-pay | Admitting: Family

## 2020-09-15 DIAGNOSIS — Z113 Encounter for screening for infections with a predominantly sexual mode of transmission: Secondary | ICD-10-CM | POA: Diagnosis not present

## 2020-09-15 DIAGNOSIS — Z1239 Encounter for other screening for malignant neoplasm of breast: Secondary | ICD-10-CM | POA: Diagnosis not present

## 2020-09-15 DIAGNOSIS — Z6833 Body mass index (BMI) 33.0-33.9, adult: Secondary | ICD-10-CM | POA: Diagnosis not present

## 2020-09-15 DIAGNOSIS — G43909 Migraine, unspecified, not intractable, without status migrainosus: Secondary | ICD-10-CM | POA: Diagnosis not present

## 2020-09-15 DIAGNOSIS — Z01419 Encounter for gynecological examination (general) (routine) without abnormal findings: Secondary | ICD-10-CM | POA: Diagnosis not present

## 2020-09-22 ENCOUNTER — Encounter: Payer: Self-pay | Admitting: Family

## 2020-09-25 ENCOUNTER — Other Ambulatory Visit: Payer: Self-pay | Admitting: Family

## 2020-09-25 DIAGNOSIS — E669 Obesity, unspecified: Secondary | ICD-10-CM

## 2020-09-25 MED ORDER — SAXENDA 18 MG/3ML ~~LOC~~ SOPN: PEN_INJECTOR | SUBCUTANEOUS | 2 refills | Status: DC

## 2020-09-27 ENCOUNTER — Encounter: Payer: Self-pay | Admitting: Family

## 2020-09-28 NOTE — Telephone Encounter (Signed)
Kristen Carey with [employer] called and states that they did not receive the correct clinical info with the prior auth. She will fax it over to [place] or you can call and correct it verbally by calling [phone] Option 4

## 2020-10-04 ENCOUNTER — Ambulatory Visit: Payer: BC Managed Care – PPO | Admitting: Family

## 2020-10-04 ENCOUNTER — Encounter: Payer: Self-pay | Admitting: Family

## 2020-10-04 ENCOUNTER — Other Ambulatory Visit: Payer: Self-pay

## 2020-10-04 DIAGNOSIS — E669 Obesity, unspecified: Secondary | ICD-10-CM

## 2020-10-04 DIAGNOSIS — R5382 Chronic fatigue, unspecified: Secondary | ICD-10-CM | POA: Diagnosis not present

## 2020-10-04 NOTE — Progress Notes (Signed)
Subjective:    Patient ID: Kristen Carey, female    DOB: Nov 14, 1980, 40 y.o.   MRN: [id number]  CC: Kristen Carey is a 40 y.o. female who presents today for follow up.   HPI: Feels well today  No new complaints.   Fatigue remains however she doesn't feel it has worsened since our prior visit.Denies alarming features including  Fever, night sweats, unusual joint or bone pain, unintentional weight loss.   Sleep study evaluation- pending evaluation with pulmonology next week  She just started saxenda 2 weeks ago. Denies nausea, abdominal pain.     GYN- follows with [facility]. She didn't have pap smear as states is UTD.   HISTORY:  Past Medical History:  Diagnosis Date  . Anxiety   . Eczema   . Migraines    Past Surgical History:  Procedure Laterality Date  . CESAREAN SECTION  1999  . TONSILLECTOMY AND ADENOIDECTOMY  1991   Family History  Problem Relation Age of Onset  . Diabetes Mother   . Hypertension Mother   . Hyperlipidemia Mother   . Cancer Maternal Grandmother        lung  . Diabetes Maternal Grandmother   . Heart disease Maternal Grandmother   . Breast cancer Maternal Grandmother 54  . Diabetes Maternal Grandfather   . Heart disease Maternal Grandfather   . Alcohol abuse Maternal Grandfather   . Stroke Paternal Grandfather   . Colon cancer Neg Hx   . Thyroid cancer Neg Hx     Allergies: Penicillins and Chantix [varenicline] Current Outpatient Medications on File Prior to Visit  Medication Sig Dispense Refill  . cyanocobalamin (,VITAMIN B-12,) 1000 MCG/ML injection Inject 1 mL (1,000 mcg total) into the muscle once. 1 mL 0  . levonorgestrel (MIRENA) 20 MCG/24HR IUD 1 each by Intrauterine route once.    . Liraglutide -Weight Management (SAXENDA) 18 MG/3ML SOPN Start 0.6mg  [place] qd, then may increase to 1.2mg  [place] qd after 4 weeks.  Max 3mg /day. 6 mL 2  . SUMAtriptan (IMITREX) 100 MG tablet Take 100 mg by mouth.     No current  facility-administered medications on file prior to visit.    Social History   Tobacco Use  . Smoking status: Current Every Day Smoker    Packs/day: 1.00  . Smokeless tobacco: Never Used  Substance Use Topics  . Alcohol use: Yes    Alcohol/week: 0.0 standard drinks  . Drug use: No    Review of Systems  Constitutional: Positive for fatigue. Negative for chills, fever and unexpected weight change.  Respiratory: Negative for cough.   Cardiovascular: Negative for chest pain and palpitations.  Gastrointestinal: Negative for nausea and vomiting.  Musculoskeletal: Negative for arthralgias.      Objective:    BP 118/80 (BP Location: Left Arm, Patient Position: Sitting, Cuff Size: Large)   Pulse 77   Temp 97.9 F (36.6 C) (Oral)   Ht 5\' 6"  (1.676 m)   Wt 210 lb 12.8 oz (95.6 kg)   SpO2 98%   BMI 34.02 kg/m  BP Readings from Last 3 Encounters:  10/04/20 118/80  08/21/20 118/78  06/26/18 126/80   Wt Readings from Last 3 Encounters:  10/04/20 210 lb 12.8 oz (95.6 kg)  08/21/20 213 lb 9.6 oz (96.9 kg)  06/26/18 232 lb 6.4 oz (105.4 kg)    Physical Exam Vitals reviewed.  Constitutional:      Appearance: She is well-developed.  Eyes:     Conjunctiva/sclera:  Conjunctivae normal.  Cardiovascular:     Rate and Rhythm: Normal rate and regular rhythm.     Pulses: Normal pulses.     Heart sounds: Normal heart sounds.  Pulmonary:     Effort: Pulmonary effort is normal.     Breath sounds: Normal breath sounds. No wheezing, rhonchi or rales.  Skin:    General: Skin is warm and dry.  Neurological:     Mental Status: She is alert.  Psychiatric:        Speech: Speech normal.        Behavior: Behavior normal.        Thought Content: Thought content normal.        Assessment & Plan:   Problem List Items Addressed This Visit      Other   Chronic fatigue    Chronic, stable. Pending OSA evaluation. Mammogram is UTD and patient will call in 6 months time so that diagnostic  images may be ordered. Discussed further evaluation for fatigue , and patient declines further escalation at this time. She prefers to see pulmonology. In the absence of alarm features today, I advised reasonable and call me with any new concerns, features.       Obesity (BMI 30-39.9)    Chronic, stable. Continue saxenda and discussed slow titration of medication to obtain 1-2 lb/ weight loss per week          I am having Kristen Carey. Pickard maintain her levonorgestrel, SUMAtriptan, cyanocobalamin, and Saxenda.   No orders of the defined types were placed in this encounter.   Return precautions given.   Risks, benefits, and alternatives of the medications and treatment plan prescribed today were discussed, and patient expressed understanding.   Education regarding symptom management and diagnosis given to patient on AVS.  Continue to follow with Kristen Grana, FNP for routine health maintenance.   Kristen Carey Alvizo and I agreed with plan.   Rennie Plowman, FNP

## 2020-10-04 NOTE — Assessment & Plan Note (Signed)
Chronic, stable. Pending OSA evaluation. Mammogram is UTD and patient will call in 6 months time so that diagnostic images may be ordered. Discussed further evaluation for fatigue , and patient declines further escalation at this time. She prefers to see pulmonology. In the absence of alarm features today, I advised reasonable and call me with any new concerns, features.

## 2020-10-04 NOTE — Assessment & Plan Note (Signed)
Chronic, stable. Continue saxenda and discussed slow titration of medication to obtain 1-2 lb/ weight loss per week

## 2020-10-05 ENCOUNTER — Ambulatory Visit: Payer: BC Managed Care – PPO | Admitting: Internal Medicine

## 2020-10-09 DIAGNOSIS — Z20822 Contact with and (suspected) exposure to covid-19: Secondary | ICD-10-CM | POA: Diagnosis not present

## 2020-10-10 ENCOUNTER — Institutional Professional Consult (permissible substitution): Payer: BC Managed Care – PPO | Admitting: Adult Health

## 2020-11-15 ENCOUNTER — Ambulatory Visit: Payer: BC Managed Care – PPO | Admitting: Primary Care

## 2020-11-15 ENCOUNTER — Other Ambulatory Visit: Payer: Self-pay

## 2020-11-15 ENCOUNTER — Encounter: Payer: Self-pay | Admitting: Primary Care

## 2020-11-15 VITALS — BP 120/80 | HR 74 | Temp 98.2°F | Ht 66.0 in | Wt 208.2 lb

## 2020-11-15 DIAGNOSIS — G4719 Other hypersomnia: Secondary | ICD-10-CM | POA: Insufficient documentation

## 2020-11-15 NOTE — Progress Notes (Signed)
'@Patient'  ID: Kristen Carey, female    DOB: [birth year], 40 y.o.   MRN: [id number]  No chief complaint on file.   Referring provider: Burnard Hawthorne, FNP  HPI: 40 year old female, current everyday smoker.  Past medical history significant for chronic fatigue, migraine headaches, obesity.  11/15/2020 Patient presents today for sleep consult. Patient reports symptoms of excessive daytime fatigue. Works in Teacher, English as a foreign language. Lab work unrevealing except for vitamin D deficiency. She inconsistently takes over the counter vitamin D supplement. She had sleep study > 20 years ago without significant findings. She does report weight gain, she is currently on Saxenda injections. Her mother has sleep apnea but does not wear CPAP. She rarely using her Albuterol, has occasional nocturnal wheezing. Denies sleep walking, narcolepsy or cataplexy symptoms.   Sleep questionnaire: Previous sleep study- previous sleep study > 20 years ago, results unavailable  Symptoms- Daytime sleepiness Bedtime- 10 pm Time to fall asleep- within minutes Nocturnal awakenings- <2 Out of bed in morning- 7:30 am Epworth score- 3  Allergies  Allergen Reactions   Penicillins Anaphylaxis    Have taken zpak w/no problems   Chantix [Varenicline]     Abnormal dreams.     Immunization History  Administered Date(s) Administered   Hepatitis B 09/17/1992   Influenza-Unspecified 02/18/2012, 02/01/2014   MMR 09/18/1986   PFIZER(Purple Top)SARS-COV-2 Vaccination 07/02/2019, 07/23/2019, 06/01/2020   Tdap 05/05/2010, 12/19/2010    Past Medical History:  Diagnosis Date   Anxiety    Eczema    Migraines     Tobacco History: Social History   Tobacco Use  Smoking Status Every Day   Packs/day: 1.00   Pack years: 0.00   Types: Cigarettes  Smokeless Tobacco Never   Ready to quit: Not Answered Counseling given: Not Answered   Outpatient Medications Prior to Visit  Medication Sig Dispense Refill   cyanocobalamin  (,VITAMIN B-12,) 1000 MCG/ML injection Inject 1 mL (1,000 mcg total) into the muscle once. 1 mL 0   levonorgestrel (MIRENA) 20 MCG/24HR IUD 1 each by Intrauterine route once.     Liraglutide -Weight Management (SAXENDA) 18 MG/3ML SOPN Start 0.58m [place] qd, then may increase to 1.246mSC qd after 4 weeks.  Max 46m89may. 6 mL 2   SUMAtriptan (IMITREX) 100 MG tablet Take 100 mg by mouth.     No facility-administered medications prior to visit.    Review of Systems  Review of Systems  Constitutional:  Positive for fatigue.  Respiratory: Negative.         Nocturnal wheezing  Psychiatric/Behavioral: Negative.      Physical Exam  BP 120/80 (BP Location: Left Arm, Patient Position: Sitting, Cuff Size: Normal)   Pulse 74   Temp 98.2 F (36.8 C) (Oral)   Ht '5\' 6"'  (1.676 m)   Wt 208 lb 3.2 oz (94.4 kg)   SpO2 98%   BMI 33.60 kg/m  Physical Exam Constitutional:      Appearance: Normal appearance.  HENT:     Head: Normocephalic and atraumatic.     Mouth/Throat:     Mouth: Mucous membranes are moist.     Pharynx: Oropharynx is clear.     Comments: Mallampati class I Cardiovascular:     Rate and Rhythm: Normal rate and regular rhythm.     Comments: RRR Pulmonary:     Effort: Pulmonary effort is normal.     Breath sounds: Normal breath sounds.     Comments: CTA Musculoskeletal:        General: Normal range  of motion.  Skin:    General: Skin is warm and dry.  Neurological:     General: No focal deficit present.     Mental Status: She is alert and oriented to person, place, and time. Mental status is at baseline.  Psychiatric:        Mood and Affect: Mood normal.        Behavior: Behavior normal.        Thought Content: Thought content normal.        Judgment: Judgment normal.     Lab Results:  CBC    Component Value Date/Time   WBC 8.5 08/21/2020 1140   RBC 4.46 08/21/2020 1140   HGB 14.0 08/21/2020 1140   HCT 42.0 08/21/2020 1140   PLT 210.0 08/21/2020 1140   MCV 94.1  08/21/2020 1140   MCHC 33.4 08/21/2020 1140   RDW 12.8 08/21/2020 1140   LYMPHSABS 2.0 08/21/2020 1140   MONOABS 0.5 08/21/2020 1140   EOSABS 0.1 08/21/2020 1140   BASOSABS 0.1 08/21/2020 1140    BMET    Component Value Date/Time   NA 137 08/21/2020 1140   K 4.1 08/21/2020 1140   CL 104 08/21/2020 1140   CO2 24 08/21/2020 1140   GLUCOSE 85 08/21/2020 1140   BUN 10 08/21/2020 1140   CREATININE 0.59 08/21/2020 1140   CALCIUM 9.1 08/21/2020 1140    BNP No results found for: BNP  ProBNP No results found for: PROBNP  Imaging: No results found.   Assessment & Plan:   Excessive daytime sleepiness - Patient reports daytime sleepiness. Epworth score 3. Needs HST to rule out sleep apnea as cause of chronic fatigue. We discussed how untreated sleep apnea can put patient at risk for cardiac arrhythmias, pulmonary HTN, diabetes, stroke. We also briefly reviewed treatment options include weight loss, oral appliance, CPAP therapy or referral to ENT for possible surgical options. Continue to encourage weight loss efforts. Follow-up in 4-6 weeks to review sleep study result, if negative for OSA patinet should follow-up back up with PCP.      Martyn Ehrich, NP 11/15/2020

## 2020-11-15 NOTE — Assessment & Plan Note (Signed)
-   Patient reports daytime sleepiness. Epworth score 3. Needs HST to rule out sleep apnea as cause of chronic fatigue. We discussed how untreated sleep apnea can put patient at risk for cardiac arrhythmias, pulmonary HTN, diabetes, stroke. We also briefly reviewed treatment options include weight loss, oral appliance, CPAP therapy or referral to ENT for possible surgical options. Continue to encourage weight loss efforts. Follow-up in 4-6 weeks to review sleep study result, if negative for OSA patinet should follow-up back up with PCP.

## 2020-11-15 NOTE — Progress Notes (Signed)
Reviewed and agree with assessment/plan.   Jakeb Lamping, MD [facility] 11/15/2020, 5:22 PM Pager:  [phone]  

## 2020-11-15 NOTE — Patient Instructions (Addendum)
  Nice meeting you today Ms Tetrault   Recommend checking sleep study to evaluate for underlying sleep apnea Sleep apnea puts you at a higher risk for cardiac arrhythmias, pulmonary HTN, diabetes, stroke Treatment options include weight loss, oral appliance, CPAP therapy or referral to ENT for possible surgical options   Orders: Home sleep study  Follow-up: Virtual visit in 4-5 weeks with [facility] NP

## 2020-11-27 ENCOUNTER — Other Ambulatory Visit: Payer: Self-pay | Admitting: Obstetrics and Gynecology

## 2020-11-28 ENCOUNTER — Telehealth: Payer: Self-pay | Admitting: Family

## 2020-11-28 NOTE — Telephone Encounter (Signed)
Call pt [place] back as rec'ed note from insurance that sleep study wasn't approved Was she able to get done through pulmonology?

## 2020-11-30 NOTE — Telephone Encounter (Signed)
Pt did see Pulmonology 6/29, but she has not heard since if they were working on sleep study approval.

## 2020-12-01 NOTE — Telephone Encounter (Signed)
noted 

## 2020-12-19 ENCOUNTER — Ambulatory Visit: Payer: BC Managed Care – PPO | Admitting: Primary Care

## 2020-12-31 ENCOUNTER — Other Ambulatory Visit: Payer: Self-pay | Admitting: Family

## 2020-12-31 DIAGNOSIS — E669 Obesity, unspecified: Secondary | ICD-10-CM

## 2021-01-01 ENCOUNTER — Ambulatory Visit: Payer: BC Managed Care – PPO | Admitting: Family

## 2021-01-01 ENCOUNTER — Other Ambulatory Visit: Payer: Self-pay | Admitting: Family

## 2021-01-01 ENCOUNTER — Other Ambulatory Visit: Payer: Self-pay

## 2021-01-01 ENCOUNTER — Encounter: Payer: Self-pay | Admitting: Family

## 2021-01-01 ENCOUNTER — Telehealth: Payer: Self-pay | Admitting: Family

## 2021-01-01 VITALS — BP 124/84 | HR 73 | Temp 98.5°F | Ht 66.0 in | Wt 207.0 lb

## 2021-01-01 DIAGNOSIS — R5382 Chronic fatigue, unspecified: Secondary | ICD-10-CM | POA: Diagnosis not present

## 2021-01-01 DIAGNOSIS — E669 Obesity, unspecified: Secondary | ICD-10-CM

## 2021-01-01 MED ORDER — INSULIN PEN NEEDLE 31G X 6 MM MISC: 1 refills | Status: DC

## 2021-01-01 MED ORDER — OZEMPIC (0.25 OR 0.5 MG/DOSE) 2 MG/1.5ML ~~LOC~~ SOPN: 0.2500 mg | PEN_INJECTOR | SUBCUTANEOUS | 3 refills | Status: DC

## 2021-01-01 MED ORDER — SAXENDA 18 MG/3ML ~~LOC~~ SOPN: 3.0000 mg | PEN_INJECTOR | Freq: Every day | SUBCUTANEOUS | 3 refills | Status: DC

## 2021-01-01 NOTE — Telephone Encounter (Signed)
I called patient & she stated that she would actually just stay on the Saxenda until Wegovy came back in stock. She would really like to try that when it is off backorder. She thought that she had a couple refills left on the Saxenda & will let us know if not. Are you okay with that? I will d/c Ozempic off chart & add Saxenda back if so?

## 2021-01-01 NOTE — Patient Instructions (Signed)
Stop saxenda  Start ozempic 0.25mg   Follow instructions below:  Ozempic 0.25 [place] weekly x 2 weeks, then 0.5 [place] weekly x 3 weeks, and if all is good, increase to 1 mg [place] weekly after that.    Nice to see you today!

## 2021-01-01 NOTE — Telephone Encounter (Signed)
Yes okay to add saxenda back

## 2021-01-01 NOTE — Progress Notes (Signed)
Subjective:    Patient ID: Kristen Carey, female    DOB: December 19, 1980, 40 y.o.   MRN: [id number]  CC: Kristen Carey is a 40 y.o. female who presents today for follow up.   HPI: Follow-up from 3 months ago. Feels well today  No new complaints  OSA evaluation is hold right now as she has had family obligations right now. She plans to reach out to pulmonology to schedule later this year.  Fatigue is unchanged . No  fever, chills.  Obesity-compliant with Saxenda 3mg  however has not had weight loss. She feels that she has plateaued.     She is going to have diagnostic mammogram due 01/2021 and has requested this order from GYN. She declines my ordering today.    HISTORY:  Past Medical History:  Diagnosis Date   Anxiety    Eczema    Migraines    Past Surgical History:  Procedure Laterality Date   CESAREAN SECTION  1999   TONSILLECTOMY AND ADENOIDECTOMY  1991   Family History  Problem Relation Age of Onset   Diabetes Mother    Hypertension Mother    Hyperlipidemia Mother    Cancer Maternal Grandmother        lung   Diabetes Maternal Grandmother    Heart disease Maternal Grandmother    Breast cancer Maternal Grandmother 37   Diabetes Maternal Grandfather    Heart disease Maternal Grandfather    Alcohol abuse Maternal Grandfather    Stroke Paternal Grandfather    Colon cancer Neg Hx    Thyroid cancer Neg Hx     Allergies: Penicillins and Chantix [varenicline] Current Outpatient Medications on File Prior to Visit  Medication Sig Dispense Refill   cyanocobalamin (,VITAMIN B-12,) 1000 MCG/ML injection Inject 1 mL (1,000 mcg total) into the muscle once. 1 mL 0   levonorgestrel (MIRENA) 20 MCG/24HR IUD 1 each by Intrauterine route once.     SUMAtriptan (IMITREX) 100 MG tablet Take 100 mg by mouth.     No current facility-administered medications on file prior to visit.    Social History   Tobacco Use   Smoking status: Every Day    Packs/day: 1.00    Types:  Cigarettes   Smokeless tobacco: Never  Substance Use Topics   Alcohol use: Yes    Alcohol/week: 0.0 standard drinks   Drug use: No    Review of Systems    Objective:    BP 124/84 (BP Location: Left Arm, Patient Position: Sitting, Cuff Size: Large)   Pulse 73   Temp 98.5 F (36.9 C) (Oral)   Ht 5\' 6"  (1.676 m)   Wt 207 lb (93.9 kg)   SpO2 99%   BMI 33.41 kg/m  BP Readings from Last 3 Encounters:  01/01/21 124/84  11/15/20 120/80  10/04/20 118/80   Wt Readings from Last 3 Encounters:  01/01/21 207 lb (93.9 kg)  11/15/20 208 lb 3.2 oz (94.4 kg)  10/04/20 210 lb 12.8 oz (95.6 kg)    Physical Exam Vitals reviewed.  Constitutional:      Appearance: She is well-developed.  Eyes:     Conjunctiva/sclera: Conjunctivae normal.  Cardiovascular:     Rate and Rhythm: Normal rate and regular rhythm.     Pulses: Normal pulses.     Heart sounds: Normal heart sounds.  Pulmonary:     Effort: Pulmonary effort is normal.     Breath sounds: Normal breath sounds. No wheezing, rhonchi or rales.  Skin:  General: Skin is warm and dry.  Neurological:     Mental Status: She is alert.  Psychiatric:        Speech: Speech normal.        Behavior: Behavior normal.        Thought Content: Thought content normal.       Assessment & Plan:   Problem List Items Addressed This Visit       Other   Chronic fatigue    Chronic, unchanged.  Again, patient discussed importance of completing OSA evaluation.  Patient very much understands this and will call to schedule this with pulmonology      Obesity (BMI 30-39.9) - Primary    Weight loss has plateaued on September tach.  We will discontinue Saxenda and trial Ozempic. I consulted with Catie Travis pharmD.  Whom advised to restart ozempic at 0.25mg .  Advised patient to start Ozempic 0.25 [place] weekly x 2 weeks, then 0.5 [place] weekly x 3 weeks, and if all is good, increase to 1 mg [place] weekly after that.  We offered her a sample of Ozempic 0.25/0.5  mg   f/u with me in 8-10 weeks      Relevant Medications   Insulin Pen Needle 31G X 6 MM MISC     I have discontinued Lucile Shutters. Moura's Saxenda. I am also having her start on Insulin Pen Needle. Additionally, I am having her maintain her levonorgestrel, SUMAtriptan, and cyanocobalamin.   Meds ordered this encounter  Medications   Insulin Pen Needle 31G X 6 MM MISC    Sig: Use one pen needle daily.    Dispense:  30 each    Refill:  1    Order Specific Question:   Supervising Provider    Answer:   Sherlene Shams [[id number]]    Return precautions given.   Risks, benefits, and alternatives of the medications and treatment plan prescribed today were discussed, and patient expressed understanding.   Education regarding symptom management and diagnosis given to patient on AVS.  Continue to follow with Allegra Grana, FNP for routine health maintenance.   Lucile Shutters Borawski and I agreed with plan.   Rennie Plowman, FNP

## 2021-01-01 NOTE — Telephone Encounter (Signed)
I have added back to patient's chart.

## 2021-01-01 NOTE — Assessment & Plan Note (Signed)
Weight loss has plateaued on September tach.  We will discontinue Saxenda and trial Ozempic. I consulted with Catie Travis pharmD.  Whom advised to restart ozempic at 0.25mg .  Advised patient to start Ozempic 0.25 [place] weekly x 2 weeks, then 0.5 [place] weekly x 3 weeks, and if all is good, increase to 1 mg [place] weekly after that.  We offered her a sample of Ozempic 0.25/0.5 mg   f/u with me in 8-10 weeks

## 2021-01-01 NOTE — Assessment & Plan Note (Signed)
Chronic, unchanged.  Again, patient discussed importance of completing OSA evaluation.  Patient very much understands this and will call to schedule this with pulmonology

## 2021-01-01 NOTE — Telephone Encounter (Signed)
LMTCB

## 2021-01-01 NOTE — Telephone Encounter (Signed)
Call pt  I consulted with Catie [facility] pharmacy  She advised that we STOP Saxenda and start with the below:   Ozempic 0.25 [place] weekly x 2 weeks, then 0.5 [place] weekly x 3 weeks, and if all is good, increase to 1 mg [place] weekly after that.   Please give her one of our samples of Ozempic 0.25/0.5 mg   Let her know that instructions above her on her AVS which she can assess through MyChart  Ensure she has f/u with me in 8-10 weeks

## 2021-01-10 ENCOUNTER — Other Ambulatory Visit: Payer: Self-pay | Admitting: Family

## 2021-01-11 ENCOUNTER — Encounter: Payer: Self-pay | Admitting: Family

## 2021-01-15 ENCOUNTER — Other Ambulatory Visit: Payer: Self-pay | Admitting: Family

## 2021-01-15 DIAGNOSIS — R928 Other abnormal and inconclusive findings on diagnostic imaging of breast: Secondary | ICD-10-CM

## 2021-01-24 ENCOUNTER — Encounter: Payer: Self-pay | Admitting: Family

## 2021-01-28 ENCOUNTER — Other Ambulatory Visit: Payer: Self-pay | Admitting: Family

## 2021-01-28 DIAGNOSIS — E669 Obesity, unspecified: Secondary | ICD-10-CM

## 2021-01-28 MED ORDER — OZEMPIC (0.25 OR 0.5 MG/DOSE) 2 MG/1.5ML ~~LOC~~ SOPN: 0.2500 mg | PEN_INJECTOR | SUBCUTANEOUS | 3 refills | Status: DC

## 2021-01-30 DIAGNOSIS — R102 Pelvic and perineal pain: Secondary | ICD-10-CM | POA: Diagnosis not present

## 2021-01-30 DIAGNOSIS — N888 Other specified noninflammatory disorders of cervix uteri: Secondary | ICD-10-CM | POA: Diagnosis not present

## 2021-01-30 DIAGNOSIS — Z113 Encounter for screening for infections with a predominantly sexual mode of transmission: Secondary | ICD-10-CM | POA: Diagnosis not present

## 2021-02-06 ENCOUNTER — Other Ambulatory Visit: Payer: Self-pay | Admitting: Family

## 2021-02-06 DIAGNOSIS — E669 Obesity, unspecified: Secondary | ICD-10-CM

## 2021-02-07 NOTE — Telephone Encounter (Signed)
Disregard patient message. Patient has been called and message has been clarified. Patient was notified over the declined PA for Saxenda submitted on 02/01/21. There is no documentation in chart for an Ozempic PA being submitted. Patient states that she can not fill her prescription until a PA for Ozempic was completed.

## 2021-02-09 ENCOUNTER — Telehealth: Payer: Self-pay | Admitting: Family

## 2021-02-09 ENCOUNTER — Ambulatory Visit
Admission: RE | Admit: 2021-02-09 | Discharge: 2021-02-09 | Disposition: A | Discharge status: Home / Self Care | Payer: BC Managed Care – PPO | Source: Ambulatory Visit | Attending: Family | Admitting: Family

## 2021-02-09 ENCOUNTER — Other Ambulatory Visit: Payer: Self-pay

## 2021-02-09 ENCOUNTER — Other Ambulatory Visit: Payer: Self-pay | Admitting: Family

## 2021-02-09 DIAGNOSIS — R928 Other abnormal and inconclusive findings on diagnostic imaging of breast: Secondary | ICD-10-CM

## 2021-02-09 DIAGNOSIS — R922 Inconclusive mammogram: Secondary | ICD-10-CM | POA: Diagnosis not present

## 2021-02-09 NOTE — Telephone Encounter (Signed)
It looks like this is already been signed and ordered.  Please let me know if this is incorrect.

## 2021-02-09 NOTE — Telephone Encounter (Signed)
Verbakl order given for diagnostic ultra sound please sign when NP back in office.

## 2021-02-09 NOTE — Telephone Encounter (Signed)
Prior Authorization for Ozempic has been submitted.   Key: [id number]  Awaiting determination.

## 2021-02-09 NOTE — Telephone Encounter (Signed)
Kristen Carey from [facility] currently has the patient at their office for a mammogram and they are requesting orders to do a breast ultrasound as well.Kristen Carey given fax number to send the order for the patient.Please advise.

## 2021-02-12 ENCOUNTER — Encounter: Payer: Self-pay | Admitting: Family

## 2021-02-19 NOTE — Telephone Encounter (Signed)
Called and spoke with Kristen Carey. Kristen Carey states that she switched over her insurance on 01/18/21. I informed her that the PA for Saxenda was submitted on 02/01/21 and was declined and that her ozempic was declined due to her not being a type 2 diabetic. Kristen Carey states that if we resubmit her claim using the PCOS diagnosis, that the Kristen Carey will go through as she is insulin resistant. Kristen Carey states that she will contact her insurance company tomorrow to see which medication they cover. Kristen Carey states that she will call back tomorrow. Spoke with Catie to determine best way to resubmit the PA for Ozempic and Saxenda and she requests the patient be referred to her to go over her medication. Kristen Carey is agreeable to seeing Catie for medication management and asks for a referral to be placed.

## 2021-02-21 ENCOUNTER — Encounter: Payer: Self-pay | Admitting: Family

## 2021-02-21 ENCOUNTER — Other Ambulatory Visit: Payer: Self-pay | Admitting: Family

## 2021-02-21 DIAGNOSIS — E669 Obesity, unspecified: Secondary | ICD-10-CM

## 2021-02-26 ENCOUNTER — Other Ambulatory Visit: Payer: Self-pay | Admitting: Family

## 2021-02-26 DIAGNOSIS — E669 Obesity, unspecified: Secondary | ICD-10-CM

## 2021-02-26 MED ORDER — TIRZEPATIDE 2.5 MG/0.5ML ~~LOC~~ SOAJ: 2.5000 mg | SUBCUTANEOUS | 1 refills | Status: DC

## 2021-02-26 NOTE — Progress Notes (Signed)
close

## 2021-02-27 ENCOUNTER — Other Ambulatory Visit: Payer: Self-pay | Admitting: Family

## 2021-02-27 DIAGNOSIS — E669 Obesity, unspecified: Secondary | ICD-10-CM

## 2021-03-01 ENCOUNTER — Telehealth: Payer: Self-pay

## 2021-03-01 NOTE — Chronic Care Management (AMB) (Signed)
  Care Management   Outreach Note  03/01/2021 Name: Kristen Carey MRN: [id number] DOB: 20-Nov-1980  Referred by: Allegra Grana, FNP Reason for referral : Care Coordination (Outreach to schedule referral with Pharm D )   An unsuccessful telephone outreach was attempted today. The patient was referred to the case management team for assistance with care management and care coordination.   Follow Up Plan:  A HIPAA compliant phone message was left for the patient providing contact information and requesting a return call. The care management team will reach out to the patient again over the next 5 days.  If patient returns call to provider office, please advise to call Embedded Care Management Care Guide Penne Lash at [phone]  Penne Lash, RMA Care Guide, Embedded Care Coordination [facility]  [place], [place] [address] Direct Dial: [phone] [name] Website: [email or web address]

## 2021-03-07 NOTE — Chronic Care Management (AMB) (Signed)
  Care Management   Outreach Note  03/07/2021 Name: LYLIA KARN MRN: [id number] DOB: 1980/09/08  Referred by: Allegra Grana, FNP Reason for referral : Care Coordination (Outreach to schedule referral with Pharm D )   An unsuccessful telephone outreach was attempted today. The patient was referred to the case management team for assistance with care management and care coordination.   Follow Up Plan:  A HIPAA compliant phone message was left for the patient providing contact information and requesting a return call.  The care management team will reach out to the patient again over the next 7 days.  If patient returns call to provider office, please advise to call Embedded Care Management Care Guide Penne Lash at [phone]  Penne Lash, RMA Care Guide, Embedded Care Coordination [facility]  [place], [place] [address] Direct Dial: [phone] [email or web address]: [email or web address]

## 2021-03-08 DIAGNOSIS — H5213 Myopia, bilateral: Secondary | ICD-10-CM | POA: Diagnosis not present

## 2021-03-19 ENCOUNTER — Encounter: Payer: Self-pay | Admitting: Family

## 2021-03-20 ENCOUNTER — Other Ambulatory Visit: Payer: Self-pay | Admitting: Family

## 2021-03-20 DIAGNOSIS — E669 Obesity, unspecified: Secondary | ICD-10-CM

## 2021-03-20 MED ORDER — TIRZEPATIDE 5 MG/0.5ML ~~LOC~~ SOAJ: 5.0000 mg | SUBCUTANEOUS | 2 refills | Status: DC

## 2021-03-28 NOTE — Chronic Care Management (AMB) (Signed)
  Care Management   Note  03/28/2021 Name: SHAILY LIBRIZZI MRN: [id number] DOB: 10-11-1980  Lucile Shutters Messenger is a 40 y.o. year old female who is a primary care patient of Taylynn, [place], FNP. I reached out to Lucile Shutters [place] by phone today in response to a referral sent by Ms. Lucile Shutters Lopez's primary care provider.   Ms. Mussell was given information about care management services today including:  Care management services include personalized support from designated clinical staff supervised by her physician, including individualized plan of care and coordination with other care providers 24/7 contact phone numbers for assistance for urgent and routine care needs. The patient may stop care management services at any time by phone call to the office staff.  Patient did not agree to enrollment in care management services and does not wish to consider at this time.  Follow up plan: Patient declines further follow up and engagement by the care management team. Appropriate care team members and provider have been notified via electronic communication.   Penne Lash, RMA Care Guide, Embedded Care Coordination [facility]  [place], [place] [address] Direct Dial: [phone] [email or web address]: [email or web address]

## 2021-04-27 ENCOUNTER — Encounter: Payer: Self-pay | Admitting: Family

## 2021-04-27 ENCOUNTER — Ambulatory Visit: Payer: BC Managed Care – PPO | Admitting: Family

## 2021-04-27 ENCOUNTER — Other Ambulatory Visit: Payer: Self-pay

## 2021-04-27 VITALS — BP 130/76 | HR 80 | Temp 98.6°F | Ht 66.0 in | Wt 205.6 lb

## 2021-04-27 DIAGNOSIS — E669 Obesity, unspecified: Secondary | ICD-10-CM | POA: Diagnosis not present

## 2021-04-27 DIAGNOSIS — F419 Anxiety disorder, unspecified: Secondary | ICD-10-CM

## 2021-04-27 MED ORDER — TIRZEPATIDE 7.5 MG/0.5ML ~~LOC~~ SOAJ: 7.5000 mg | SUBCUTANEOUS | 1 refills | Status: DC

## 2021-04-27 MED ORDER — BUSPIRONE HCL 7.5 MG PO TABS: 7.5000 mg | ORAL_TABLET | Freq: Two times a day (BID) | ORAL | 2 refills | Status: DC

## 2021-04-27 NOTE — Assessment & Plan Note (Signed)
Uncontrolled.  Patient prefers as needed medication for anxiety.  We will start BuSpar 7.5 mg scheduled twice daily.  Advised her the medication more effective when taken as scheduled as circumstances around her family/ stressors would not resolve right away.  Advised that we will wean off this medication when she no longer needs.

## 2021-04-27 NOTE — Patient Instructions (Signed)
Start buspar 7.5mg  twice per day  If you decide to stop this medication, we will need to wean off of it.  I also increased mounjaro to 7.5mg    Please let me know how you are doing.

## 2021-04-27 NOTE — Progress Notes (Signed)
Subjective:    Patient ID: Kristen Carey, female    DOB: [birth year], 40 y.o.   MRN: [id number]  CC: Kristen Carey is a 40 y.o. female who presents today for follow up.   HPI: Worried about family and upcoming surgeries.  She is interested medication for anxiety as needed.  Anxiety comes and goes.  Work is going well.  No cp.  She was on zoloft in the past without improvement. She tried atarax which made her sleepy.  NO depression. Sleeping well  4 weeks ago increased mounjaro to 5mg  . No weight loss and interested in increasing dose.  No nausea, abdominal pain HISTORY:  Past Medical History:  Diagnosis Date   Anxiety    Eczema    Migraines    Past Surgical History:  Procedure Laterality Date   CESAREAN SECTION  1999   TONSILLECTOMY AND ADENOIDECTOMY  1991   Family History  Problem Relation Age of Onset   Diabetes Mother    Hypertension Mother    Hyperlipidemia Mother    Cancer Maternal Grandmother        lung   Diabetes Maternal Grandmother    Heart disease Maternal Grandmother    Breast cancer Maternal Grandmother 39   Diabetes Maternal Grandfather    Heart disease Maternal Grandfather    Alcohol abuse Maternal Grandfather    Stroke Paternal Grandfather    Colon cancer Neg Hx    Thyroid cancer Neg Hx     Allergies: Penicillins and Chantix [varenicline] Current Outpatient Medications on File Prior to Visit  Medication Sig Dispense Refill   cyanocobalamin (,VITAMIN B-12,) 1000 MCG/ML injection Inject 1 mL (1,000 mcg total) into the muscle once. 1 mL 0   Insulin Pen Needle 31G X 6 MM MISC Use one pen needle daily. 30 each 1   levonorgestrel (MIRENA) 20 MCG/24HR IUD 1 each by Intrauterine route once.     SUMAtriptan (IMITREX) 100 MG tablet Take 100 mg by mouth.     No current facility-administered medications on file prior to visit.    Social History   Tobacco Use   Smoking status: Every Day    Packs/day: 1.00    Types: Cigarettes   Smokeless  tobacco: Never  Substance Use Topics   Alcohol use: Yes    Alcohol/week: 0.0 standard drinks   Drug use: No    Review of Systems  Constitutional:  Negative for chills and fever.  Respiratory:  Negative for cough.   Cardiovascular:  Negative for chest pain and palpitations.  Gastrointestinal:  Negative for nausea and vomiting.  Psychiatric/Behavioral:  The patient is nervous/anxious.      Objective:    BP 130/76   Pulse 80   Temp 98.6 F (37 C) (Oral)   Ht 5\' 6"  (1.676 m)   Wt 205 lb 9.6 oz (93.3 kg)   SpO2 98%   BMI 33.18 kg/m  BP Readings from Last 3 Encounters:  04/27/21 130/76  01/01/21 124/84  11/15/20 120/80   Wt Readings from Last 3 Encounters:  04/27/21 205 lb 9.6 oz (93.3 kg)  01/01/21 207 lb (93.9 kg)  11/15/20 208 lb 3.2 oz (94.4 kg)    Physical Exam Vitals reviewed.  Constitutional:      Appearance: She is well-developed.  Eyes:     Conjunctiva/sclera: Conjunctivae normal.  Cardiovascular:     Rate and Rhythm: Normal rate and regular rhythm.     Pulses: Normal pulses.     Heart sounds: Normal heart  sounds.  Pulmonary:     Effort: Pulmonary effort is normal.     Breath sounds: Normal breath sounds. No wheezing, rhonchi or rales.  Skin:    General: Skin is warm and dry.  Neurological:     Mental Status: She is alert.  Psychiatric:        Speech: Speech normal.        Behavior: Behavior normal.        Thought Content: Thought content normal.       Assessment & Plan:   Problem List Items Addressed This Visit       Other   Anxiety    Uncontrolled.  Patient prefers as needed medication for anxiety.  We will start BuSpar 7.5 mg scheduled twice daily.  Advised her the medication more effective when taken as scheduled as circumstances around her family/ stressors would not resolve right away.  Advised that we will wean off this medication when she no longer needs.      Relevant Medications   busPIRone (BUSPAR) 7.5 MG tablet   Obesity (BMI  30-39.9) - Primary    Tolerating Mounjaro.  Increase to 7.5mg       Relevant Medications   tirzepatide (MOUNJARO) 7.5 MG/0.5ML Pen     I have discontinued Lucile Shutters. Cominsky's tirzepatide. I am also having her start on tirzepatide and busPIRone. Additionally, I am having her maintain her levonorgestrel, SUMAtriptan, cyanocobalamin, and Insulin Pen Needle.   Meds ordered this encounter  Medications   tirzepatide (MOUNJARO) 7.5 MG/0.5ML Pen    Sig: Inject 7.5 mg into the skin once a week.    Dispense:  6 mL    Refill:  1    Order Specific Question:   Supervising Provider    Answer:   Duncan Dull L [[id number]]   busPIRone (BUSPAR) 7.5 MG tablet    Sig: Take 1 tablet (7.5 mg total) by mouth 2 (two) times daily.    Dispense:  60 tablet    Refill:  2    Order Specific Question:   Supervising Provider    Answer:   Sherlene Shams [[id number]]     Return precautions given.   Risks, benefits, and alternatives of the medications and treatment plan prescribed today were discussed, and patient expressed understanding.   Education regarding symptom management and diagnosis given to patient on AVS.  Continue to follow with Allegra Grana, FNP for routine health maintenance.   Lucile Shutters [place] and I agreed with plan.   Rennie Plowman, FNP

## 2021-04-27 NOTE — Assessment & Plan Note (Signed)
Tolerating Mounjaro.  Increase to 7.5mg 

## 2021-04-27 NOTE — Progress Notes (Signed)
Pre visit review using our clinic review tool, if applicable. No additional management support is needed unless otherwise documented below in the visit note. 

## 2021-04-30 ENCOUNTER — Encounter: Payer: Self-pay | Admitting: Family

## 2021-05-24 ENCOUNTER — Other Ambulatory Visit: Payer: Self-pay | Admitting: Family

## 2021-05-24 DIAGNOSIS — F419 Anxiety disorder, unspecified: Secondary | ICD-10-CM

## 2021-05-25 ENCOUNTER — Other Ambulatory Visit: Payer: Self-pay | Admitting: Family

## 2021-05-25 DIAGNOSIS — F419 Anxiety disorder, unspecified: Secondary | ICD-10-CM

## 2021-05-25 NOTE — Telephone Encounter (Signed)
Alternative requested insurance will not cover Bupropion

## 2021-05-28 ENCOUNTER — Telehealth: Payer: Self-pay | Admitting: Family

## 2021-05-28 ENCOUNTER — Other Ambulatory Visit: Payer: Self-pay | Admitting: Family

## 2021-05-28 NOTE — Telephone Encounter (Signed)
LMTCB & stated that I would also send her mychart as she is most likely at work.

## 2021-05-28 NOTE — Telephone Encounter (Signed)
Call patient I received note that BuSpar 7.5 mg is not covered Usually BuSpar is covered.  I am wondering if BuSpar 5 mg is covered.  If so, I could prescribe BuSpar 5 mg 3 times daily Please let me know

## 2021-05-29 ENCOUNTER — Other Ambulatory Visit: Payer: Self-pay

## 2021-05-29 MED ORDER — BUSPIRONE HCL 5 MG PO TABS: 5.0000 mg | ORAL_TABLET | Freq: Three times a day (TID) | ORAL | 3 refills | Status: DC

## 2021-05-29 NOTE — Telephone Encounter (Signed)
Responded through [employer] & all resolved.

## 2021-06-12 ENCOUNTER — Other Ambulatory Visit: Payer: Self-pay | Admitting: Family

## 2021-06-12 ENCOUNTER — Other Ambulatory Visit: Payer: Self-pay | Admitting: Obstetrics and Gynecology

## 2021-06-12 ENCOUNTER — Encounter: Payer: Self-pay | Admitting: Family

## 2021-06-12 ENCOUNTER — Other Ambulatory Visit: Payer: Self-pay

## 2021-06-12 DIAGNOSIS — E669 Obesity, unspecified: Secondary | ICD-10-CM

## 2021-06-24 ENCOUNTER — Other Ambulatory Visit: Payer: Self-pay | Admitting: Family

## 2021-07-14 ENCOUNTER — Encounter: Payer: Self-pay | Admitting: Family

## 2021-07-16 ENCOUNTER — Other Ambulatory Visit: Payer: Self-pay

## 2021-07-16 MED ORDER — TIRZEPATIDE 10 MG/0.5ML ~~LOC~~ SOAJ: 10.0000 mg | SUBCUTANEOUS | 1 refills | Status: DC

## 2021-07-30 IMAGING — MG MM DIGITAL DIAGNOSTIC UNILAT*R* W/ TOMO W/ CAD
8 series · 8 of 24 positions shown · non-contrast
Comparison: Baseline mammography 01/05/2020.

CLINICAL DATA: Recall from baseline screening mammography with
tomosynthesis, 2 possible asymmetries in the RIGHT breast that were
visible only on the CC images.

EXAM:
DIGITAL DIAGNOSTIC RIGHT MAMMOGRAM WITH CAD AND TOMO
ULTRASOUND RIGHT BREAST

[R ML synth-2D (1 of 2)]
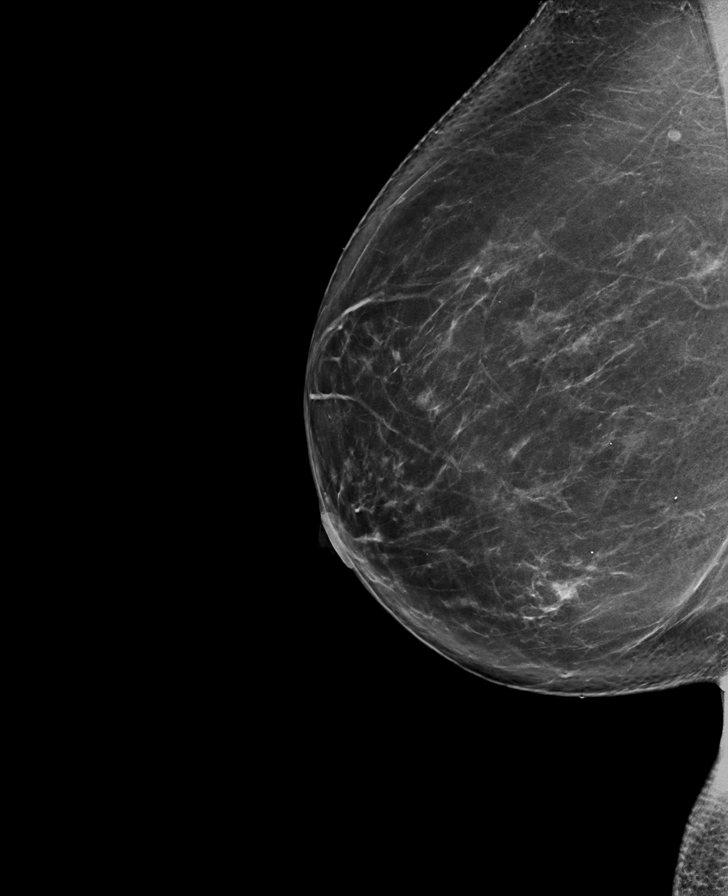

[R ML synth-2D (2 of 2)]
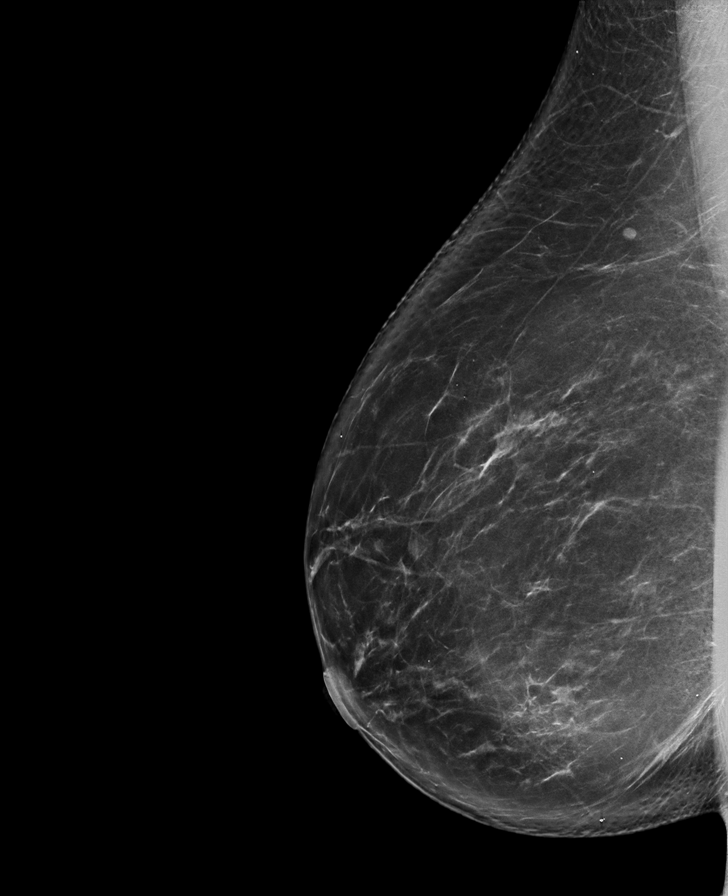

[R CC synth-2D (1 of 2)]
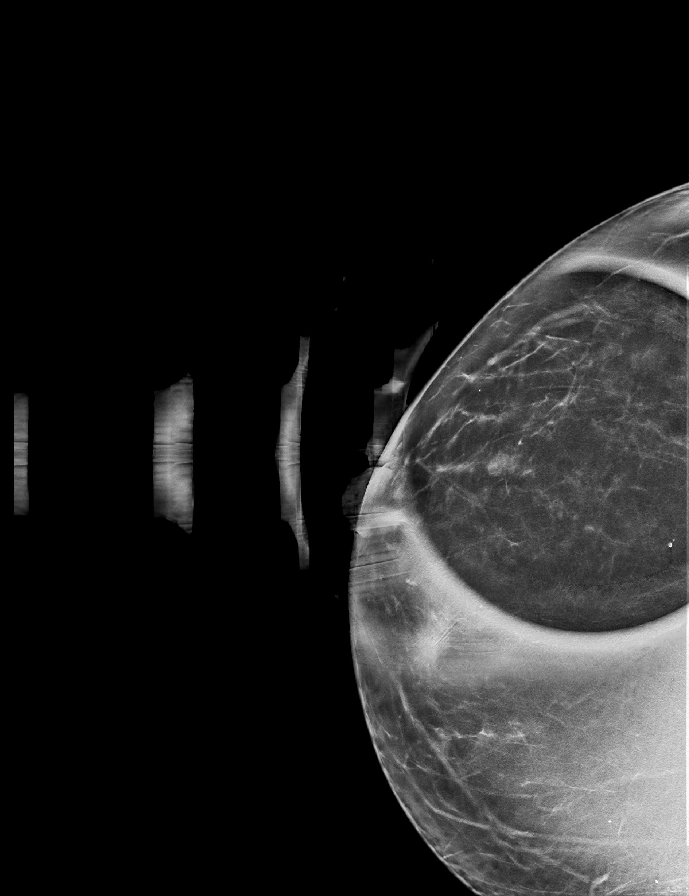

[R CC synth-2D (2 of 2)]
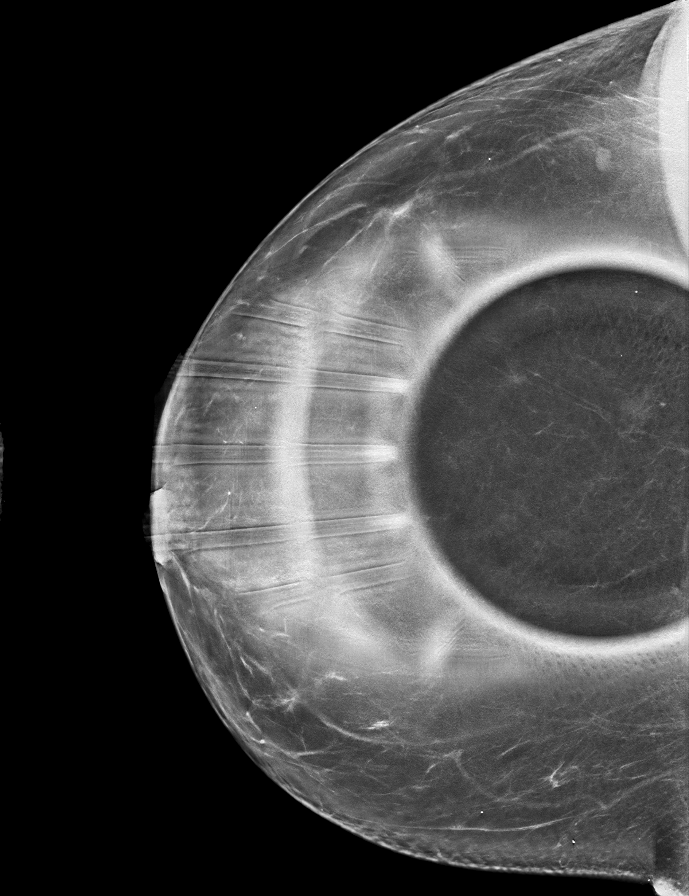

[R ML tomo (1 of 2) · tomo slice 48/95.0]
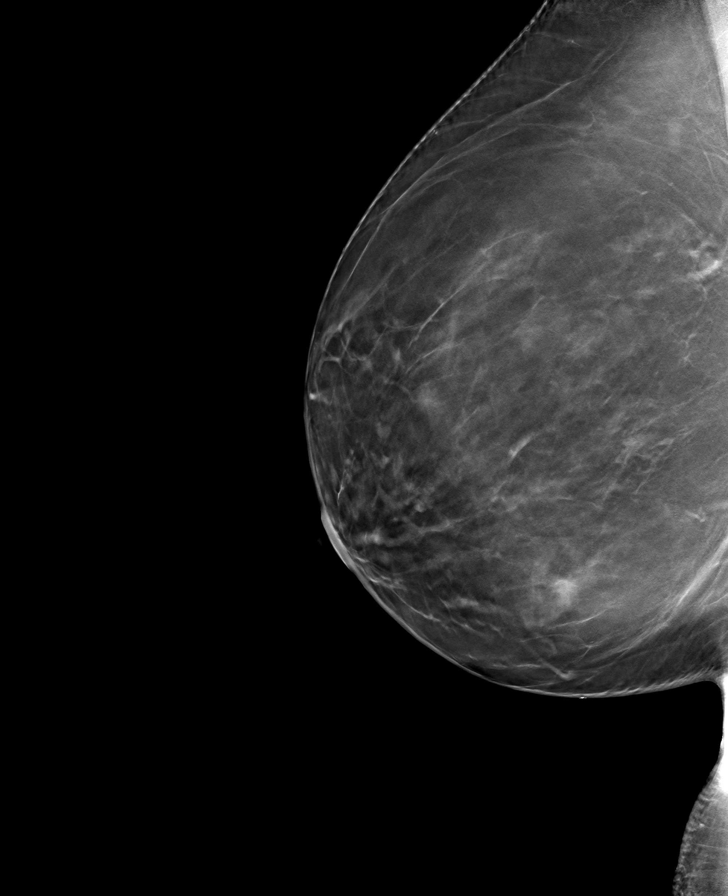

[R CC tomo (1 of 2) · tomo slice 44/87.0]
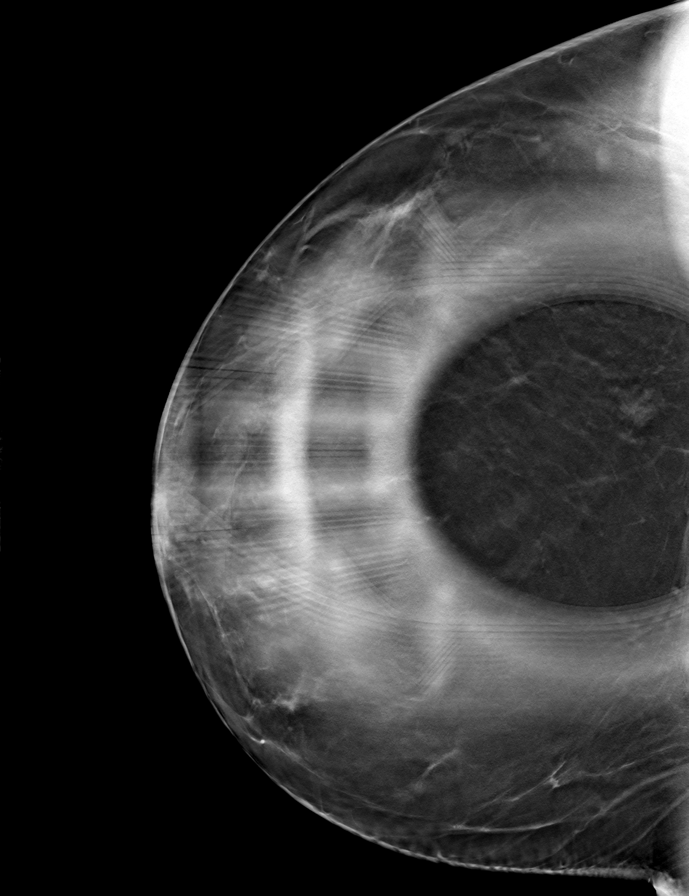

[R CC tomo (2 of 2) · tomo slice 38/75.0]
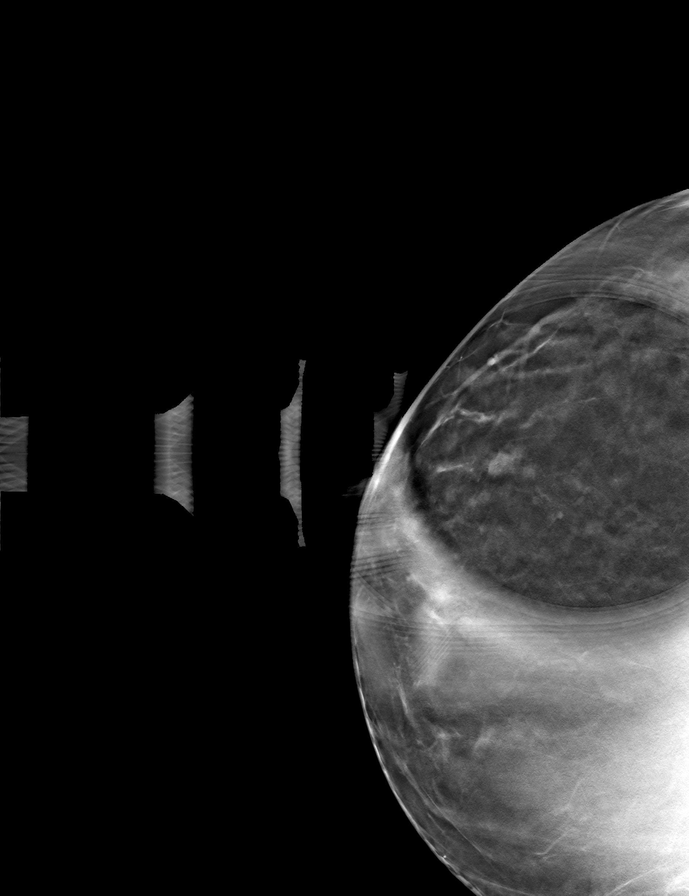

[R ML tomo (2 of 2) · tomo slice 49/96.0]
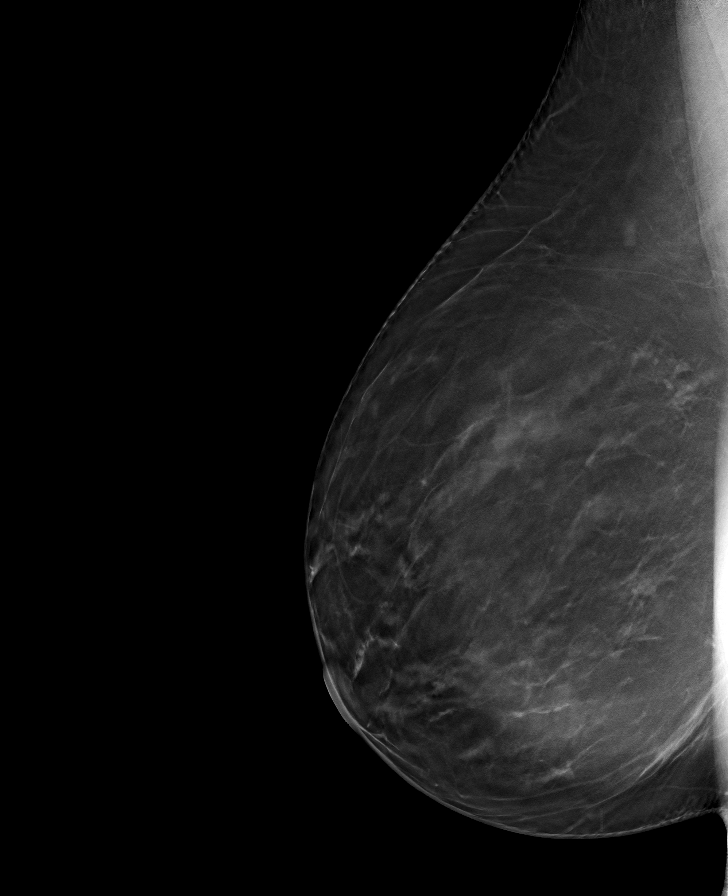

[8 of 24 positions shown; findings below may reference images not displayed]

No prior ultrasound.

ACR Breast Density Category b: There are scattered areas of
fibroglandular density.
FINDINGS: Tomosynthesis and synthesized spot compression CC views of the areas
of concern in the RIGHT breast and a tomosynthesis and synthesized
full field mediolateral view of the RIGHT breast were obtained.

The asymmetry questioned in the OUTER periareolar location is shown
on the spot compression image to represent a partially obscured
isodense mass whose visible margins are circumscribed, measuring
approximately 0.7 cm. There is no associated architectural
distortion or suspicious calcifications. The mass localizes to the
UPPER breast on the full field mediolateral images therefore in the
UPPER OUTER quadrant.

The asymmetry questioned in the retroareolar location at POSTERIOR
depth persists though partially disperses with compression, and has
the appearance of an island of fibroglandular tissue on the
mediolateral images. There is no associated architectural distortion
or suspicious calcifications. The asymmetry localizes to the UPPER
breast on the mediolateral images, indicating its position at or
near 12 o'clock.

The full field mediolateral image was processed with CAD.

Targeted RIGHT breast ultrasound is performed, showing a benign
simple cyst at the 10 o'clock position approximately 3 cm from the
nipple measuring approximately 7 x 3 x 7 mm, demonstrating posterior
acoustic enhancement and no internal power Doppler flow,
corresponding to the mammographic finding in the OUTER periareolar
location.

Normal scattered fibroglandular tissue is present in the UPPER
breast at POSTERIOR depth. No cyst, solid mass or abnormal acoustic
shadowing is identified in the UPPER breast with imaging from 11
o'clock through 12 o'clock to 1 o'clock.
IMPRESSION: 1. Likely benign focal asymmetry in the UPPER RIGHT breast at
POSTERIOR depth without sonographic correlate, likely an isolated
island of fibroglandular tissue, corresponding to the baseline
screening mammographic finding.
2. Benign 7 mm simple cyst in the UPPER OUTER periareolar RIGHT
breast which accounts for the second baseline screening mammographic
finding.

RECOMMENDATION:
Diagnostic RIGHT mammogram in 6 months.

I have discussed the findings and recommendations with the patient.
If applicable, a reminder letter will be sent to the patient
regarding the next appointment.

BI-RADS CATEGORY  3: Probably benign.

## 2021-09-25 DIAGNOSIS — Z130 Encounter for screening for diseases of the blood and blood-forming organs and certain disorders involving the immune mechanism: Secondary | ICD-10-CM | POA: Diagnosis not present

## 2021-09-25 DIAGNOSIS — G43909 Migraine, unspecified, not intractable, without status migrainosus: Secondary | ICD-10-CM | POA: Diagnosis not present

## 2021-09-25 DIAGNOSIS — Z1239 Encounter for other screening for malignant neoplasm of breast: Secondary | ICD-10-CM | POA: Diagnosis not present

## 2021-09-25 DIAGNOSIS — Z01419 Encounter for gynecological examination (general) (routine) without abnormal findings: Secondary | ICD-10-CM | POA: Diagnosis not present

## 2021-09-25 DIAGNOSIS — Z131 Encounter for screening for diabetes mellitus: Secondary | ICD-10-CM | POA: Diagnosis not present

## 2021-09-25 DIAGNOSIS — Z13220 Encounter for screening for lipoid disorders: Secondary | ICD-10-CM | POA: Diagnosis not present

## 2021-09-25 DIAGNOSIS — Z113 Encounter for screening for infections with a predominantly sexual mode of transmission: Secondary | ICD-10-CM | POA: Diagnosis not present

## 2021-09-27 LAB — LAB REPORT - SCANNED
A1c: 5.1
EGFR: 113
HM HIV Screening: NEGATIVE
HM Hepatitis Screen: NEGATIVE

## 2021-10-15 ENCOUNTER — Other Ambulatory Visit: Payer: Self-pay | Admitting: Family

## 2021-10-15 ENCOUNTER — Encounter: Payer: Self-pay | Admitting: Family

## 2021-10-16 ENCOUNTER — Other Ambulatory Visit: Payer: Self-pay

## 2021-10-16 MED ORDER — TIRZEPATIDE 10 MG/0.5ML ~~LOC~~ SOAJ: 10.0000 mg | SUBCUTANEOUS | 1 refills | Status: DC

## 2021-11-05 ENCOUNTER — Encounter: Payer: Self-pay | Admitting: Family

## 2021-11-07 ENCOUNTER — Encounter: Payer: Self-pay | Admitting: Family Medicine

## 2021-11-07 ENCOUNTER — Ambulatory Visit: Payer: BC Managed Care – PPO | Admitting: Family Medicine

## 2021-11-07 DIAGNOSIS — L739 Follicular disorder, unspecified: Secondary | ICD-10-CM | POA: Diagnosis not present

## 2021-11-07 DIAGNOSIS — R87619 Unspecified abnormal cytological findings in specimens from cervix uteri: Secondary | ICD-10-CM | POA: Insufficient documentation

## 2021-11-07 MED ORDER — DOXYCYCLINE HYCLATE 100 MG PO TABS: 100.0000 mg | ORAL_TABLET | Freq: Two times a day (BID) | ORAL | 0 refills | Status: DC

## 2021-11-07 NOTE — Assessment & Plan Note (Signed)
Patient with a likely infected hair follicle.  She has had these before and they have responded to antibiotics.  We will treat with doxycycline.  She will let us know if not improving.  She has an IUD and I counseled her on not getting pregnant while taking the doxycycline.  Discussed the risk of skin sensitivity while on this and the risk of diarrhea.  She was advised to use good skin protection while taking the doxycycline and advised to use a probiotic or eat yogurt while taking the doxycycline.

## 2021-11-07 NOTE — Progress Notes (Signed)
  Marikay Alar, MD Phone: [phone]  Kristen Carey is a 41 y.o. female who presents today for same-day visit.  Blocked sweat gland: Patient reports she has a blocked sweat and in her inguinal/buttock area.  She has had these before and been treated with antibiotics.  She notes no pain or drainage.  She notes she has been picking at it has been red from that.  No fevers.  Social History   Tobacco Use  Smoking Status Every Day   Packs/day: 1.00   Types: Cigarettes  Smokeless Tobacco Never    Current Outpatient Medications on File Prior to Visit  Medication Sig Dispense Refill   cyanocobalamin (,VITAMIN B-12,) 1000 MCG/ML injection Inject 1 mL (1,000 mcg total) into the muscle once. 1 mL 0   Insulin Pen Needle 31G X 6 MM MISC Use one pen needle daily. 30 each 1   levonorgestrel (MIRENA) 20 MCG/24HR IUD 1 each by Intrauterine route once.     SUMAtriptan (IMITREX) 100 MG tablet Take 100 mg by mouth.     tirzepatide (MOUNJARO) 10 MG/0.5ML Pen Inject 10 mg into the skin once a week. 6 mL 1   busPIRone (BUSPAR) 5 MG tablet TAKE 1 TABLET BY MOUTH THREE TIMES A DAY (Patient not taking: Reported on 11/07/2021) 270 tablet 2   No current facility-administered medications on file prior to visit.     ROS see history of present illness  Objective  Physical Exam Vitals:   11/07/21 1429  BP: 110/70  Pulse: 80  Temp: 98.8 F (37.1 C)  SpO2: 99%    BP Readings from Last 3 Encounters:  11/07/21 110/70  04/27/21 130/76  01/01/21 124/84   Wt Readings from Last 3 Encounters:  11/07/21 183 lb (83 kg)  04/27/21 205 lb 9.6 oz (93.3 kg)  01/01/21 207 lb (93.9 kg)    Physical Exam Genitourinary:      Comments: Charlyne Mom, CMA served as chaperone     Assessment/Plan: Please see individual problem list.  Problem List Items Addressed This Visit     Folliculitis    Patient with a likely infected hair follicle.  She has had these before and they have responded to  antibiotics.  We will treat with doxycycline.  She will let us know if not improving.  She has an IUD and I counseled her on not getting pregnant while taking the doxycycline.  Discussed the risk of skin sensitivity while on this and the risk of diarrhea.  She was advised to use good skin protection while taking the doxycycline and advised to use a probiotic or eat yogurt while taking the doxycycline.      Relevant Medications   doxycycline (VIBRA-TABS) 100 MG tablet     Return if symptoms worsen or fail to improve.   Marikay Alar, MD [facility]

## 2021-11-08 ENCOUNTER — Ambulatory Visit: Payer: BC Managed Care – PPO | Admitting: Internal Medicine

## 2021-11-21 ENCOUNTER — Encounter: Payer: Self-pay | Admitting: Family

## 2021-11-28 ENCOUNTER — Encounter: Payer: Self-pay | Admitting: Family

## 2021-11-28 ENCOUNTER — Telehealth: Payer: Self-pay

## 2021-11-28 NOTE — Telephone Encounter (Signed)
Called patient to inform her that the PA for Marny Lowenstein was Denied

## 2021-11-28 NOTE — Telephone Encounter (Signed)
Patient called back and I read the message back to her.  Patient said, ok and she will follow up.

## 2021-12-03 ENCOUNTER — Other Ambulatory Visit: Payer: Self-pay

## 2021-12-07 ENCOUNTER — Other Ambulatory Visit: Payer: Self-pay

## 2021-12-07 MED ORDER — WEGOVY 0.25 MG/0.5ML ~~LOC~~ SOAJ: 0.2500 mg | SUBCUTANEOUS | 1 refills | Status: DC

## 2021-12-17 ENCOUNTER — Telehealth: Payer: BC Managed Care – PPO | Admitting: Physician Assistant

## 2021-12-17 DIAGNOSIS — J340 Abscess, furuncle and carbuncle of nose: Secondary | ICD-10-CM

## 2021-12-17 DIAGNOSIS — J3489 Other specified disorders of nose and nasal sinuses: Secondary | ICD-10-CM | POA: Diagnosis not present

## 2021-12-18 MED ORDER — SULFAMETHOXAZOLE-TRIMETHOPRIM 800-160 MG PO TABS: 1.0000 | ORAL_TABLET | Freq: Two times a day (BID) | ORAL | 0 refills | Status: DC

## 2021-12-18 NOTE — Progress Notes (Signed)
I have spent 5 minutes in review of e-visit questionnaire, review and updating patient chart, medical decision making and response to patient.   Amirrah Quigley Cody Gabriellah Rabel, PA-C    

## 2021-12-18 NOTE — Progress Notes (Signed)
E Visit for Cellulitis ° °We are sorry that you are not feeling well. Here is how we plan to help! ° °Based on what you shared with me it looks like you have cellulitis.  Cellulitis looks like areas of skin redness, swelling, and warmth; it develops as a result of bacteria entering under the skin. Little red spots and/or bleeding can be seen in skin, and tiny surface sacs containing fluid can occur. Fever can be present. Cellulitis is almost always on one side of a body, and the lower limbs are the most common site of involvement.  ° °I have prescribed:  Bactrim DS 1 tablet by mouth twice a day for 7 days ° °HOME CARE: ° °Take your medications as ordered and take all of them, even if the skin irritation appears to be healing.  ° °GET HELP RIGHT AWAY IF: ° °Symptoms that don't begin to go away within 48 hours. °Severe redness persists or worsens °If the area turns color, spreads or swells. °If it blisters and opens, develops yellow-brown crust or bleeds. °You develop a fever or chills. °If the pain increases or becomes unbearable.  °Are unable to keep fluids and food down. ° °MAKE SURE YOU  ° °Understand these instructions. °Will watch your condition. °Will get help right away if you are not doing well or get worse. ° °Thank you for choosing an e-visit. ° °Your e-visit answers were reviewed by a board certified advanced clinical practitioner to complete your personal care plan. Depending upon the condition, your plan could have included both over the counter or prescription medications. ° °Please review your pharmacy choice. Make sure the pharmacy is open so you can pick up prescription now. If there is a problem, you may contact your provider through MyChart messaging and have the prescription routed to another pharmacy.  Your safety is important to us. If you have drug allergies check your prescription carefully.  ° °For the next 24 hours you can use MyChart to ask questions about today's visit, request a  non-urgent call back, or ask for a work or school excuse. °You will get an email in the next two days asking about your experience. I hope that your e-visit has been valuable and will speed your recovery. ° °

## 2021-12-20 DIAGNOSIS — J340 Abscess, furuncle and carbuncle of nose: Secondary | ICD-10-CM | POA: Diagnosis not present

## 2021-12-20 DIAGNOSIS — J3489 Other specified disorders of nose and nasal sinuses: Secondary | ICD-10-CM | POA: Diagnosis not present

## 2022-01-03 ENCOUNTER — Encounter: Payer: Self-pay | Admitting: Family

## 2022-01-04 ENCOUNTER — Other Ambulatory Visit: Payer: Self-pay | Admitting: Family

## 2022-01-04 DIAGNOSIS — E669 Obesity, unspecified: Secondary | ICD-10-CM

## 2022-01-04 MED ORDER — WEGOVY 0.5 MG/0.5ML ~~LOC~~ SOAJ: 0.5000 mg | SUBCUTANEOUS | 2 refills | Status: DC

## 2022-01-07 ENCOUNTER — Other Ambulatory Visit: Payer: Self-pay

## 2022-01-07 MED ORDER — SEMAGLUTIDE-WEIGHT MANAGEMENT 0.25 MG/0.5ML ~~LOC~~ SOAJ
0.2500 mg | SUBCUTANEOUS | 0 refills | Status: DC
Start: 2022-01-07 — End: 2022-02-04

## 2022-01-31 ENCOUNTER — Other Ambulatory Visit: Payer: Self-pay | Admitting: Obstetrics and Gynecology

## 2022-01-31 DIAGNOSIS — N6489 Other specified disorders of breast: Secondary | ICD-10-CM

## 2022-01-31 DIAGNOSIS — Z1231 Encounter for screening mammogram for malignant neoplasm of breast: Secondary | ICD-10-CM

## 2022-02-04 ENCOUNTER — Telehealth: Payer: Self-pay | Admitting: Family

## 2022-02-04 ENCOUNTER — Telehealth (INDEPENDENT_AMBULATORY_CARE_PROVIDER_SITE_OTHER): Payer: BC Managed Care – PPO | Admitting: Family

## 2022-02-04 ENCOUNTER — Encounter: Payer: Self-pay | Admitting: Family

## 2022-02-04 VITALS — Wt 187.0 lb

## 2022-02-04 DIAGNOSIS — R41840 Attention and concentration deficit: Secondary | ICD-10-CM

## 2022-02-04 DIAGNOSIS — R7303 Prediabetes: Secondary | ICD-10-CM

## 2022-02-04 MED ORDER — TIRZEPATIDE 2.5 MG/0.5ML ~~LOC~~ SOAJ: 2.5000 mg | SUBCUTANEOUS | 2 refills | Status: DC

## 2022-02-04 NOTE — Telephone Encounter (Signed)
error 

## 2022-02-04 NOTE — Progress Notes (Signed)
Virtual Visit via Video Note  I connected with@  on 02/04/22 at  9:00 AM EDT by a video enabled telemedicine application and verified that I am speaking with the correct person using two identifiers.  Location patient: home Location provider:work  Persons participating in the virtual visit: patient, provider  I discussed the limitations of evaluation and management by telemedicine and the availability of in person appointments. The patient expressed understanding and agreed to proceed.   HPI: She is frustrated by weight loss medication and lack of coverage from insurance.   She was able to take [facility] for a short period of time and then cost increased.   She was able to take one month of wegovy , she couldn't find the 0.5mg . She has been previously been on mounjaro. Mancel Parsons is on backordered. No constipation or nausea.   She has gained 12 lbs since being off the wegovy. At lowest 176lb. She would like be 175lbs.   Prediabetic. Family h/o DM. Intolerant to metformin. Wellbutrin made her emotional.    She follows with GYN in [facility], Dr Karle Starch. Reports a1c 5.1 in 09/25/21 while on mounjaro.   She had been on Vyvanse with prior PCP.  She would like to start medication again for ADHD.  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    ROS: See pertinent positives and negatives per HPI.    EXAM:  VITALS per patient if applicable: Wt 187 lb (84.8 kg)   BMI 30.18 kg/m  BP Readings from Last 3 Encounters:  11/07/21 110/70  04/27/21 130/76  01/01/21 124/84   Wt Readings from Last 3 Encounters:  02/04/22 187 lb (84.8 kg)  11/07/21 183 lb (83 kg)  04/27/21 205 lb 9.6 oz (93.3 kg)     GENERAL: alert, oriented, appears well and in no acute distress  HEENT: atraumatic, conjunttiva clear, no obvious abnormalities on inspection of external nose and ears  NECK: normal movements of the head and neck  LUNGS: on inspection no signs of respiratory  distress, breathing rate appears normal, no obvious gross SOB, gasping or wheezing  CV: no obvious cyanosis  MS: moves all visible extremities without noticeable abnormality  PSYCH/NEURO: pleasant and cooperative, no obvious depression or anxiety, speech and thought processing grossly intact  ASSESSMENT AND PLAN:  Discussed the following assessment and plan:  Problem List Items Addressed This Visit       Other   Concentration deficit    History of ADHD.  Formally diagnosed years ago however does not have record of this.  Referral placed to [facility] psychology for formal diagnosis and evaluation of ADHD.      Relevant Orders   Ambulatory referral to Psychiatry   Prediabetes - Primary    She is intolerant to metformin, Wellbutrin.  Previously tried phentermine which was unsuccessful.  History of prediabetes.  Family history of diabetes.  We opted to retrial [facility] as this is been very successful for patient most recently.  A1c 5.1, 4 months ago while on Mounjaro.  Mancel Parsons was backordered so she was unable to transition to [facility].   Once it becomes available, most likely we will transition to [facility] for maintenance.       Relevant Medications   tirzepatide [facility]) 2.5 MG/0.5ML Pen    -we discussed possible serious and likely etiologies, options for evaluation and workup, limitations of telemedicine visit vs in person visit, treatment, treatment risks and precautions. Pt prefers to treat via telemedicine empirically rather then risking  or undertaking an in person visit at this moment.  .   I discussed the assessment and treatment plan with the patient. The patient was provided an opportunity to ask questions and all were answered. The patient agreed with the plan and demonstrated an understanding of the instructions.   The patient was advised to call back or seek an in-person evaluation if the symptoms worsen or if the condition fails to improve as anticipated.   Rennie Plowman, FNP

## 2022-02-04 NOTE — Patient Instructions (Signed)
Restart Mounjaro. Referral to [facility] psychology for formal ADHD testing Let us know if you dont hear back within a week in regards to an appointment being scheduled.

## 2022-02-04 NOTE — Assessment & Plan Note (Addendum)
She is intolerant to metformin, Wellbutrin.  Previously tried phentermine which was unsuccessful.  History of prediabetes.  Family history of diabetes.  We opted to retrial [facility] as this is been very successful for patient most recently.  A1c 5.1, 4 months ago while on Mounjaro.  Mancel Parsons was backordered so she was unable to transition to [facility].   Once it becomes available, most likely we will transition to [facility] for maintenance.

## 2022-02-04 NOTE — Assessment & Plan Note (Signed)
History of ADHD.  Formally diagnosed years ago however does not have record of this.  Referral placed to [facility] psychology for formal diagnosis and evaluation of ADHD.

## 2022-02-18 ENCOUNTER — Other Ambulatory Visit: Payer: BC Managed Care – PPO

## 2022-02-18 ENCOUNTER — Inpatient Hospital Stay: Admission: RE | Admit: 2022-02-18 | Payer: BC Managed Care – PPO | Source: Ambulatory Visit

## 2022-02-26 ENCOUNTER — Encounter: Payer: Self-pay | Admitting: Family

## 2022-03-06 ENCOUNTER — Other Ambulatory Visit: Payer: BC Managed Care – PPO

## 2022-03-08 ENCOUNTER — Ambulatory Visit
Admission: RE | Admit: 2022-03-08 | Discharge: 2022-03-08 | Disposition: A | Discharge status: Home / Self Care | Payer: BC Managed Care – PPO | Source: Ambulatory Visit | Attending: Obstetrics and Gynecology | Admitting: Obstetrics and Gynecology

## 2022-03-08 DIAGNOSIS — N6489 Other specified disorders of breast: Secondary | ICD-10-CM

## 2022-03-08 DIAGNOSIS — Z1231 Encounter for screening mammogram for malignant neoplasm of breast: Secondary | ICD-10-CM

## 2022-03-08 DIAGNOSIS — R92313 Mammographic fatty tissue density, bilateral breasts: Secondary | ICD-10-CM | POA: Diagnosis not present

## 2022-04-04 DIAGNOSIS — Z79899 Other long term (current) drug therapy: Secondary | ICD-10-CM | POA: Diagnosis not present

## 2022-04-04 DIAGNOSIS — F1520 Other stimulant dependence, uncomplicated: Secondary | ICD-10-CM | POA: Diagnosis not present

## 2022-04-04 DIAGNOSIS — Z5181 Encounter for therapeutic drug level monitoring: Secondary | ICD-10-CM | POA: Diagnosis not present

## 2022-04-05 DIAGNOSIS — F900 Attention-deficit hyperactivity disorder, predominantly inattentive type: Secondary | ICD-10-CM | POA: Diagnosis not present

## 2022-04-18 DIAGNOSIS — F900 Attention-deficit hyperactivity disorder, predominantly inattentive type: Secondary | ICD-10-CM | POA: Diagnosis not present

## 2022-05-16 DIAGNOSIS — F900 Attention-deficit hyperactivity disorder, predominantly inattentive type: Secondary | ICD-10-CM | POA: Diagnosis not present

## 2022-06-13 ENCOUNTER — Telehealth: Payer: BC Managed Care – PPO | Admitting: Physician Assistant

## 2022-06-13 DIAGNOSIS — F900 Attention-deficit hyperactivity disorder, predominantly inattentive type: Secondary | ICD-10-CM | POA: Diagnosis not present

## 2022-06-13 DIAGNOSIS — N907 Vulvar cyst: Secondary | ICD-10-CM

## 2022-06-14 MED ORDER — SULFAMETHOXAZOLE-TRIMETHOPRIM 800-160 MG PO TABS: 1.0000 | ORAL_TABLET | Freq: Two times a day (BID) | ORAL | 0 refills | Status: DC

## 2022-06-14 NOTE — Progress Notes (Signed)
E Visit for Cellulitis  We are sorry that you are not feeling well. Here is how we plan to help!  Based on what you shared with me it looks like you have a sebaceous cyst.  I have prescribed:  Bactrim DS 1 tablet by mouth twice a day for 7 days  HOME CARE:  Take your medications as ordered and take all of them, even if the skin irritation appears to be healing.   GET HELP RIGHT AWAY IF:  Symptoms that don't begin to go away within 48 hours. Severe redness persists or worsens If the area turns color, spreads or swells. If it blisters and opens, develops yellow-brown crust or bleeds. You develop a fever or chills. If the pain increases or becomes unbearable.  Are unable to keep fluids and food down.  MAKE SURE YOU   Understand these instructions. Will watch your condition. Will get help right away if you are not doing well or get worse.  Thank you for choosing an e-visit.  Your e-visit answers were reviewed by a board certified advanced clinical practitioner to complete your personal care plan. Depending upon the condition, your plan could have included both over the counter or prescription medications.  Please review your pharmacy choice. Make sure the pharmacy is open so you can pick up prescription now. If there is a problem, you may contact your provider through [employer] and have the prescription routed to another pharmacy.  Your safety is important to [place]. If you have drug allergies check your prescription carefully.   For the next 24 hours you can use MyChart to ask questions about today's visit, request a non-urgent call back, or ask for a work or school excuse. You will get an email in the next two days asking about your experience. I hope that your e-visit has been valuable and will speed your recovery.  I have spent 5 minutes in review of e-visit questionnaire, review and updating patient chart, medical decision making and response to patient.   Mar Daring, PA-C

## 2022-08-12 ENCOUNTER — Encounter: Payer: Self-pay | Admitting: Family

## 2022-08-13 ENCOUNTER — Other Ambulatory Visit (HOSPITAL_COMMUNITY): Payer: Self-pay

## 2022-08-14 ENCOUNTER — Other Ambulatory Visit (HOSPITAL_COMMUNITY): Payer: Self-pay

## 2022-08-14 ENCOUNTER — Telehealth: Payer: Self-pay

## 2022-08-14 NOTE — Telephone Encounter (Signed)
Pharmacy Patient Advocate Encounter   Received notification from [place] that prior authorization for [facility] 0.5MG /0.5ML auto-injectors is required/requested.  Per Test Claim: PA required   PA submitted on 08/14/22 to (ins) [place] [employer] via CoverMyMeds Key or (Medicaid) confirmation # [id number] Status is pending

## 2022-08-15 DIAGNOSIS — F900 Attention-deficit hyperactivity disorder, predominantly inattentive type: Secondary | ICD-10-CM | POA: Diagnosis not present

## 2022-08-16 NOTE — Telephone Encounter (Signed)
Pharmacy Patient Advocate Encounter  Received notification from [facility] that the request for prior authorization for Wegovy 0.5MG /0.5ML auto-injectors has been denied due to The requested service is not a covered benefit.     Please be advised we currently do not have a Pharmacist to review denials, therefore you will need to process appeals accordingly as needed. Thanks for your support at this time.   You may fax [phone] to appeal. Lyda Perone is also available (Key: [id number])

## 2022-08-21 ENCOUNTER — Telehealth: Payer: BC Managed Care – PPO | Admitting: Family Medicine

## 2022-08-21 DIAGNOSIS — N760 Acute vaginitis: Secondary | ICD-10-CM

## 2022-08-21 DIAGNOSIS — B9689 Other specified bacterial agents as the cause of diseases classified elsewhere: Secondary | ICD-10-CM

## 2022-08-22 MED ORDER — METRONIDAZOLE 500 MG PO TABS: 500.0000 mg | ORAL_TABLET | Freq: Two times a day (BID) | ORAL | 0 refills | Status: AC

## 2022-08-22 NOTE — Progress Notes (Signed)

## 2022-10-01 ENCOUNTER — Telehealth: Payer: BC Managed Care – PPO | Admitting: Physician Assistant

## 2022-10-01 DIAGNOSIS — B9689 Other specified bacterial agents as the cause of diseases classified elsewhere: Secondary | ICD-10-CM | POA: Diagnosis not present

## 2022-10-01 DIAGNOSIS — N760 Acute vaginitis: Secondary | ICD-10-CM

## 2022-10-02 MED ORDER — METRONIDAZOLE 500 MG PO TABS: 500.0000 mg | ORAL_TABLET | Freq: Two times a day (BID) | ORAL | 0 refills | Status: AC

## 2022-10-02 NOTE — Progress Notes (Signed)
E-Visit for Vaginal Symptoms  We are sorry that you are not feeling well. Here is how we plan to help! Based on what you shared with me it looks like you: May have a vaginosis due to bacteria  Vaginosis is an inflammation of the vagina that can result in discharge, itching and pain. The cause is usually a change in the normal balance of vaginal bacteria or an infection. Vaginosis can also result from reduced estrogen levels after menopause.  The most common causes of vaginosis are:   Bacterial vaginosis which results from an overgrowth of one on several organisms that are normally present in your vagina.   Yeast infections which are caused by a naturally occurring fungus called candida.   Vaginal atrophy (atrophic vaginosis) which results from the thinning of the vagina from reduced estrogen levels after menopause.   Trichomoniasis which is caused by a parasite and is commonly transmitted by sexual intercourse.  Factors that increase your risk of developing vaginosis include: Medications, such as antibiotics and steroids Uncontrolled diabetes Use of hygiene products such as bubble bath, vaginal spray or vaginal deodorant Douching Wearing damp or tight-fitting clothing Using an intrauterine device (IUD) for birth control Hormonal changes, such as those associated with pregnancy, birth control pills or menopause Sexual activity Having a sexually transmitted infection  Your treatment plan is Metronidazole or Flagyl 500mg twice a day for 7 days.  I have electronically sent this prescription into the pharmacy that you have chosen.  Be sure to take all of the medication as directed. Stop taking any medication if you develop a rash, tongue swelling or shortness of breath. Mothers who are breast feeding should consider pumping and discarding their breast milk while on these antibiotics. However, there is no consensus that infant exposure at these doses would be harmful.  Remember that  medication creams can weaken latex condoms. .   HOME CARE:  Good hygiene may prevent some types of vaginosis from recurring and may relieve some symptoms:  Avoid baths, hot tubs and whirlpool spas. Rinse soap from your outer genital area after a shower, and dry the area well to prevent irritation. Don't use scented or harsh soaps, such as those with deodorant or antibacterial action. Avoid irritants. These include scented tampons and pads. Wipe from front to back after using the toilet. Doing so avoids spreading fecal bacteria to your vagina.  Other things that may help prevent vaginosis include:  Don't douche. Your vagina doesn't require cleansing other than normal bathing. Repetitive douching disrupts the normal organisms that reside in the vagina and can actually increase your risk of vaginal infection. Douching won't clear up a vaginal infection. Use a latex condom. Both female and female latex condoms may help you avoid infections spread by sexual contact. Wear cotton underwear. Also wear pantyhose with a cotton crotch. If you feel comfortable without it, skip wearing underwear to bed. Yeast thrives in moist environments Your symptoms should improve in the next day or two.  GET HELP RIGHT AWAY IF:  You have pain in your lower abdomen ( pelvic area or over your ovaries) You develop nausea or vomiting You develop a fever Your discharge changes or worsens You have persistent pain with intercourse You develop shortness of breath, a rapid pulse, or you faint.  These symptoms could be signs of problems or infections that need to be evaluated by a medical provider now.  MAKE SURE YOU   Understand these instructions. Will watch your condition. Will get help right   away if you are not doing well or get worse.  Thank you for choosing an e-visit.  Your e-visit answers were reviewed by a board certified advanced clinical practitioner to complete your personal care plan. Depending upon the  condition, your plan could have included both over the counter or prescription medications.  Please review your pharmacy choice. Make sure the pharmacy is open so you can pick up prescription now. If there is a problem, you may contact your provider through MyChart messaging and have the prescription routed to another pharmacy.  Your safety is important to us. If you have drug allergies check your prescription carefully.   For the next 24 hours you can use MyChart to ask questions about today's visit, request a non-urgent call back, or ask for a work or school excuse. You will get an email in the next two days asking about your experience. I hope that your e-visit has been valuable and will speed your recovery.  I have spent 5 minutes in review of e-visit questionnaire, review and updating patient chart, medical decision making and response to patient.   Delania Ferg M Jacub Waiters, PA-C  

## 2022-10-28 ENCOUNTER — Other Ambulatory Visit: Payer: Self-pay | Admitting: Obstetrics and Gynecology

## 2022-10-28 DIAGNOSIS — Z01419 Encounter for gynecological examination (general) (routine) without abnormal findings: Secondary | ICD-10-CM | POA: Diagnosis not present

## 2022-10-28 DIAGNOSIS — Z131 Encounter for screening for diabetes mellitus: Secondary | ICD-10-CM | POA: Diagnosis not present

## 2022-10-28 DIAGNOSIS — Z124 Encounter for screening for malignant neoplasm of cervix: Secondary | ICD-10-CM | POA: Diagnosis not present

## 2022-10-28 DIAGNOSIS — Z13220 Encounter for screening for lipoid disorders: Secondary | ICD-10-CM | POA: Diagnosis not present

## 2022-10-28 DIAGNOSIS — Z130 Encounter for screening for diseases of the blood and blood-forming organs and certain disorders involving the immune mechanism: Secondary | ICD-10-CM | POA: Diagnosis not present

## 2022-10-28 DIAGNOSIS — Z113 Encounter for screening for infections with a predominantly sexual mode of transmission: Secondary | ICD-10-CM | POA: Diagnosis not present

## 2022-10-28 DIAGNOSIS — E559 Vitamin D deficiency, unspecified: Secondary | ICD-10-CM | POA: Diagnosis not present

## 2022-10-28 DIAGNOSIS — Z1329 Encounter for screening for other suspected endocrine disorder: Secondary | ICD-10-CM | POA: Diagnosis not present

## 2022-10-28 DIAGNOSIS — Z1331 Encounter for screening for depression: Secondary | ICD-10-CM | POA: Diagnosis not present

## 2022-10-28 DIAGNOSIS — Z1231 Encounter for screening mammogram for malignant neoplasm of breast: Secondary | ICD-10-CM

## 2022-10-28 DIAGNOSIS — E538 Deficiency of other specified B group vitamins: Secondary | ICD-10-CM | POA: Diagnosis not present

## 2022-10-30 LAB — LAB REPORT - SCANNED
A1c: 5.6
HM HIV Screening: NEGATIVE
HM Hepatitis Screen: NEGATIVE
TSH: 1.92 (ref 0.41–5.90)

## 2022-11-29 ENCOUNTER — Telehealth: Payer: Self-pay | Admitting: Family

## 2022-11-29 ENCOUNTER — Encounter: Payer: Self-pay | Admitting: Family

## 2022-11-29 ENCOUNTER — Ambulatory Visit: Payer: BC Managed Care – PPO | Admitting: Family

## 2022-11-29 VITALS — BP 116/80 | HR 70 | Temp 98.3°F | Ht 66.0 in | Wt 195.6 lb

## 2022-11-29 DIAGNOSIS — R591 Generalized enlarged lymph nodes: Secondary | ICD-10-CM | POA: Insufficient documentation

## 2022-11-29 DIAGNOSIS — E669 Obesity, unspecified: Secondary | ICD-10-CM | POA: Diagnosis not present

## 2022-11-29 DIAGNOSIS — Z6831 Body mass index (BMI) 31.0-31.9, adult: Secondary | ICD-10-CM

## 2022-11-29 MED ORDER — ZEPBOUND 2.5 MG/0.5ML ~~LOC~~ SOAJ: 2.5000 mg | SUBCUTANEOUS | 2 refills | Status: DC

## 2022-11-29 NOTE — Assessment & Plan Note (Signed)
Persisted. No uri sxs. Pending Ct soft tissue neck. She had cbc lab with GYN recently and reports normal. She declines repeating  lab work today.

## 2022-11-29 NOTE — Telephone Encounter (Signed)
Lft pt vm to call ofc to sch CT. thanks 

## 2022-11-29 NOTE — Progress Notes (Signed)
Assessment & Plan:  Lymphadenopathy Assessment & Plan: Persisted. No uri sxs. Pending Ct soft tissue neck. She had cbc lab with GYN recently and reports normal. She declines repeating  lab work today.   Orders: -     CT SOFT TISSUE NECK W CONTRAST; Future  Obesity (BMI 30-39.9) Assessment & Plan: Trial of zepbound. Previously had been on mounjaro. Counseled on titration.   Orders: -     Zepbound; Inject 2.5 mg into the skin once a week.  Dispense: 2 mL; Refill: 2     Return precautions given.   Risks, benefits, and alternatives of the medications and treatment plan prescribed today were discussed, and patient expressed understanding.   Education regarding symptom management and diagnosis given to patient on AVS either electronically or printed.  No follow-ups on file.  Rennie Plowman, FNP  Subjective:    Patient ID: Kristen Carey, female    DOB: 23-Aug-1980, 42 y.o.   MRN: [id number]  CC: Kristen Carey is a 42 y.o. female who presents today for an acute visit.    HPI: Right sided lymph node couple of months ago.  Non tender. Non mobile.    No associated cough, fever, ear pain, sore throat, weight loss.  No recent URI No associated Mammogram is scheduled No history of cancer  She has gained weight. Tried wegovy, saxenda,  and mounjaro and both became expensive. Intolerant to metformin; side effect from wellbutrin.   10/28/22 Pap smear normal done with [facility]   No personal or family h/o thyroid cancer   Allergies: Penicillins and Chantix [varenicline] Current Outpatient Medications on File Prior to Visit  Medication Sig Dispense Refill   cyanocobalamin (,VITAMIN B-12,) 1000 MCG/ML injection Inject 1 mL (1,000 mcg total) into the muscle once. 1 mL 0   levonorgestrel (MIRENA) 20 MCG/24HR IUD 1 each by Intrauterine route once.     SUMAtriptan (IMITREX) 100 MG tablet Take 100 mg by mouth.     No current facility-administered medications on  file prior to visit.    Review of Systems  Constitutional:  Negative for chills and fever.  HENT:  Negative for ear pain, sinus pressure and sore throat.   Respiratory:  Negative for cough.   Cardiovascular:  Negative for chest pain and palpitations.  Gastrointestinal:  Negative for nausea and vomiting.  Hematological:  Positive for adenopathy.      Objective:    BP 116/80   Pulse 70   Temp 98.3 F (36.8 C) (Oral)   Ht 5\' 6"  (1.676 m)   Wt 195 lb 9.6 oz (88.7 kg)   LMP  (LMP Unknown)   SpO2 97%   BMI 31.57 kg/m   BP Readings from Last 3 Encounters:  11/29/22 116/80  11/07/21 110/70  04/27/21 130/76   Wt Readings from Last 3 Encounters:  11/29/22 195 lb 9.6 oz (88.7 kg)  02/04/22 187 lb (84.8 kg)  11/07/21 183 lb (83 kg)    Physical Exam Vitals reviewed.  Constitutional:      Appearance: She is well-developed.  HENT:     Head: Normocephalic and atraumatic.     Right Ear: Hearing, tympanic membrane, ear canal and external ear normal. No decreased hearing noted. No drainage, swelling or tenderness. No middle ear effusion. No foreign body. Tympanic membrane is not erythematous or bulging.     Left Ear: Hearing, tympanic membrane, ear canal and external ear normal. No decreased hearing noted. No drainage, swelling or tenderness.  No  middle ear effusion. No foreign body. Tympanic membrane is not erythematous or bulging.     Nose: Nose normal. No rhinorrhea.     Right Sinus: No maxillary sinus tenderness or frontal sinus tenderness.     Left Sinus: No maxillary sinus tenderness or frontal sinus tenderness.     Mouth/Throat:     Pharynx: Uvula midline. No oropharyngeal exudate or posterior oropharyngeal erythema.     Tonsils: No tonsillar abscesses.  Eyes:     Conjunctiva/sclera: Conjunctivae normal.  Neck:     Thyroid: No thyroid mass, thyromegaly or thyroid tenderness.  Cardiovascular:     Rate and Rhythm: Regular rhythm.     Pulses: Normal pulses.     Heart sounds:  Normal heart sounds.  Pulmonary:     Effort: Pulmonary effort is normal.     Breath sounds: Normal breath sounds. No wheezing, rhonchi or rales.  Lymphadenopathy:     Head:     Right side of head: No submental, submandibular, tonsillar, preauricular, posterior auricular or occipital adenopathy.     Left side of head: No submental, submandibular, tonsillar, preauricular, posterior auricular or occipital adenopathy.     Cervical: Cervical adenopathy present.     Right cervical: Superficial cervical adenopathy present.     Comments: Nontender superficial anterior lymph node appreciated on exam. Non fluctuant.   Skin:    General: Skin is warm and dry.  Neurological:     Mental Status: She is alert.  Psychiatric:        Speech: Speech normal.        Behavior: Behavior normal.        Thought Content: Thought content normal.

## 2022-11-29 NOTE — Patient Instructions (Addendum)
I ordered CT neck  Let us know if you dont hear back within a week in regards to an appointment being scheduled.   So that you are aware, if you are [facility] MyChart user , please pay attention to your MyChart messages as you may receive a MyChart message with a phone number to call and schedule this test/appointment own your own from our referral coordinator. This is a new process so I do not want you to miss this message.  If you are not a MyChart user, you will receive a phone call.    start Zepbound 2.5mg  once per week injected subcutaneously ( [place])  in stomach. Please clean with alcohol swab prior to injection and be sure to rotate site. You may schedule a nurse visit if you would like to first injection.   After 4 weeks, and if tolerated and weight loss has not reached 1-2 lbs per week, please increase to 5mg  once per week [place].  Once you are actively losing weight, you do not need to further increase medication.  Dose increments are below.   7.5 mg/0.5 mL (0.5 mL) 10 mg/0.5 mL (0.5 mL) 12.5 mg/0.5 mL (0.5 mL) 15 mg/0.5 mL (0.5 mL)   Please read information on medication below and remember black box warning that you may not take if you or a family member is diagnosed with thyroid cancer (medullary thyroid cancer), or multiple endocrine neoplasia.       Brand Names: [place] Mounjaro; Zepbound Brand Names: [place] Mounjaro  Warning  This drug has been shown to cause thyroid cancer in some animals. It is not known if this happens in humans. If thyroid cancer happens, it may be deadly if not found and treated early. Call your doctor right away if you have a neck mass, trouble breathing, trouble swallowing, or have hoarseness that will not go away.  Do not use this drug if you have a health problem called Multiple Endocrine Neoplasia syndrome type 2 (MEN 2), or if you or a family member have had thyroid cancer.  Have your blood work checked and thyroid ultrasounds as you have been told by your  doctor. What is this drug used for?  It is used to lower blood sugar in people with type 2 diabetes.  It is used to help with weight loss in certain people. What do I need to tell my doctor BEFORE I take this drug? All products:  If you are allergic to this drug; any part of this drug; or any other drugs, foods, or substances. Tell your doctor about the allergy and what signs you had.  If you have ever had pancreatitis.  If you have stomach or bowel problems.  If you are using another drug that has the same drug in it.  If you are using another drug like this one. If you are not sure, ask your doctor or pharmacist. If you are using this drug for diabetes:  If you have type 1 diabetes. Do not use this drug to treat type 1 diabetes. Zepbound:  If you have or have ever had depression or thoughts of suicide. This is not a list of all drugs or health problems that interact with this drug. Tell your doctor and pharmacist about all of your drugs (prescription or OTC, natural products, vitamins) and health problems. You must check to make sure that it is safe for you to take this drug with all of your drugs and health problems. Do not start, stop, or  change the dose of any drug without checking with your doctor. What are some things I need to know or do while I take this drug? All products:  Tell all of your health care providers that you take this drug. This includes your doctors, nurses, pharmacists, and dentists.  Follow the diet and workout plan that your doctor told you about.  Talk with your doctor before you drink alcohol.  Birth control pills may not work as well to prevent pregnancy. If you take birth control pills, you may need to switch to another type of hormone-based birth control like a vaginal ring if your doctor tells you to. If another type of hormone-based birth control is not an option, use some other kind of birth control also, like a condom. Do this for 4 weeks after starting this  drug and for 4 weeks each time the dose is raised.  This drug may prevent other drugs taken by mouth from getting into the body. If you take other drugs by mouth, you may need to take them at some other time than this drug. Talk with your doctor.  Do not share with another person even if the needle has been changed. Sharing your tray or pen may pass infections from one person to another. This includes infections you may not know you have.  If you cannot drink liquids by mouth or if you have upset stomach, throwing up, or diarrhea that does not go away; you need to avoid getting dehydrated. Contact your doctor to find out what to do. Dehydration may lead to low blood pressure or to new or worse kidney problems.  A severe and sometimes deadly pancreas problem (pancreatitis) has happened with other drugs like this one. If you are using this drug for diabetes:  It may be harder to control blood sugar during times of stress such as fever, infection, injury, or surgery. A change in physical activity, exercise, or diet may also affect blood sugar.  Check your blood sugar as you have been told by your doctor.  Do not drive if your blood sugar has been low. There is a greater chance of you having a crash.  Wear disease medical alert ID (identification).  Tell your doctor if you are pregnant, plan on getting pregnant, or are breast-feeding. You will need to talk about the benefits and risks to you and the baby. Zepbound:  If you have high blood sugar (diabetes), you will need to watch your blood sugar closely.  Weight loss during pregnancy may cause harm to the unborn baby. If you get pregnant while taking this drug or if you want to get pregnant, call your doctor right away.  Tell your doctor if you are breast-feeding. You will need to talk about any risks to your baby. What are some side effects that I need to call my doctor about right away? WARNING/CAUTION: Even though it may be rare, some people may have  very bad and sometimes deadly side effects when taking a drug. Tell your doctor or get medical help right away if you have any of the following signs or symptoms that may be related to a very bad side effect: All products:  Signs of an allergic reaction, like rash; hives; itching; red, swollen, blistered, or peeling skin with or without fever; wheezing; tightness in the chest or throat; trouble breathing, swallowing, or talking; unusual hoarseness; or swelling of the mouth, face, lips, tongue, or throat.  Signs of kidney problems like unable to pass  urine, change in how much urine is passed, blood in the urine, or a big weight gain.  Signs of gallbladder problems like pain in the upper right belly area, right shoulder area, or between the shoulder blades; yellow skin or eyes; fever with chills; bloating; or very upset stomach or throwing up.  Signs of a pancreas problem (pancreatitis) like very bad stomach pain, very bad back pain, or very bad upset stomach or throwing up.  Dizziness or passing out.  A fast heartbeat.  Change in eyesight.  Low blood sugar can happen. The chance may be raised when this drug is used with other drugs for diabetes. Signs may be dizziness, headache, feeling sleepy or weak, shaking, fast heartbeat, confusion, hunger, or sweating. Call your doctor right away if you have any of these signs. Follow what you have been told to do for low blood sugar. This may include taking glucose tablets, liquid glucose, or some fruit juices. Zepbound:  New or worse behavior or mood changes like depression or thoughts of suicide. What are some other side effects of this drug? All drugs may cause side effects. However, many people have no side effects or only have minor side effects. Call your doctor or get medical help if any of these side effects or any other side effects bother you or do not go away: All products:  Constipation, diarrhea, stomach pain, upset stomach, throwing up, or  decreased appetite.  Heartburn.  Pain, itching, or other irritation where the injection was given. Zepbound:  Feeling tired or weak. These are not all of the side effects that may occur. If you have questions about side effects, call your doctor. Call your doctor for medical advice about side effects. You may report side effects to your national health agency. How is this drug best taken? Use this drug as ordered by your doctor. Read all information given to you. Follow all instructions closely. All products:  It is given as a shot into the fatty part of the skin on the top of the thigh, belly area, or upper arm.  If you will be giving yourself the shot, your doctor or nurse will teach you how to give the shot.  Keep taking this drug as you have been told by your doctor or other health care provider, even if you feel well.  Take the same day each week.  Move site where you give the shot each time.  Take with or without food.  Wash your hands before and after use.  Do not use if the solution is leaking or has particles.  This drug is colorless to a faint yellow. Do not use if the solution changes color.  Do not move this drug from the pen to a syringe.  Each pen or vial is for 1 use only. Throw away any part of the used pen after the dose is given.  Throw away needles in a needle/sharp disposal box. Do not reuse needles or other items. When the box is full, follow all local rules for getting rid of it. Talk with a doctor or pharmacist if you have any questions. If you are using this drug for diabetes:  If you are also using insulin, you may inject this drug and the insulin in the same area of the body but not right next to each other.  Do not mix this drug in the same syringe with insulin. What do I do if I miss a dose?  If it is within 4  days after the missed dose, take the missed dose and go back to your normal day.  If it has been more than 4 days since the missed dose, skip the missed  dose and go back to your normal day.  Do not take 2 doses at the same time or extra doses. How do I store and/or throw out this drug?  Store in a refrigerator. Do not freeze.  Do not use if it has been frozen.  If needed, each pen or vial may be stored at room temperature for up to 21 days. If you store at room temperature, throw away any part not used after 21 days.  Protect from heat.  Store in the original container to protect from light.  Keep all drugs in a safe place. Keep all drugs out of the reach of children and pets.  Throw away unused or expired drugs. Do not flush down a toilet or pour down a drain unless you are told to do so. Check with your pharmacist if you have questions about the best way to throw out drugs. There may be drug take-back programs in your area. General drug facts  If your symptoms or health problems do not get better or if they become worse, call your doctor.  Do not share your drugs with others and do not take anyone else's drugs.  Some drugs may have another patient information leaflet. If you have any questions about this drug, please talk with your doctor, nurse, pharmacist, or other health care provider.  If you think there has been an overdose, call your poison control center or get medical care right away. Be ready to tell or show what was taken, how much, and when it happened. Last Reviewed Date 2022-04-05 Consumer Information Use and Disclaimer This generalized information is a limited summary of diagnosis, treatment, and/or medication information. It is not meant to be comprehensive and should be used as a tool to help the user understand and/or assess potential diagnostic and treatment options. It does NOT include all information about conditions, treatments, medications, side effects, or risks that may apply to a specific patient. It is not intended to be medical advice or a substitute for the medical advice, diagnosis, or treatment of a health care  provider based on the health care provider's examination and assessment of a patient's specific and unique circumstances. Patients must speak with a health care provider for complete information about their health, medical questions, and treatment options, including any risks or benefits regarding use of medications. This information does not endorse any treatments or medications as safe, effective, or approved for treating a specific patient. Up[employer], Inc[employer] and its affiliates disclaim any warranty or liability relating to this information or the use thereof. The use of this information is governed by the Terms of Use, available at https://www.wolterskluwer.com/en/know/clinical-effectiveness-terms.  2023 Up[employer]Date, Inc. and its affiliates and/or licensors. All rights reserved. Use of UpToDate is subject to the Terms of Use. Topic E246205 Version 16.0

## 2022-11-29 NOTE — Assessment & Plan Note (Signed)
Trial of zepbound. Previously had been on mounjaro. Counseled on titration.

## 2022-12-05 ENCOUNTER — Encounter: Payer: Self-pay | Admitting: Family

## 2022-12-06 ENCOUNTER — Ambulatory Visit: Payer: BC Managed Care – PPO

## 2022-12-09 ENCOUNTER — Other Ambulatory Visit: Payer: Self-pay | Admitting: Family

## 2022-12-27 ENCOUNTER — Telehealth: Payer: BC Managed Care – PPO | Admitting: Family Medicine

## 2022-12-27 DIAGNOSIS — B9689 Other specified bacterial agents as the cause of diseases classified elsewhere: Secondary | ICD-10-CM

## 2022-12-27 DIAGNOSIS — N760 Acute vaginitis: Secondary | ICD-10-CM | POA: Diagnosis not present

## 2022-12-28 MED ORDER — METRONIDAZOLE 500 MG PO TABS: 500.0000 mg | ORAL_TABLET | Freq: Three times a day (TID) | ORAL | 0 refills | Status: AC

## 2022-12-28 NOTE — Progress Notes (Signed)
E-Visit for Vaginal Symptoms  We are sorry that you are not feeling well. Here is how we plan to help! Based on what you shared with me it looks like you: May have a vaginosis due to bacteria  Vaginosis is an inflammation of the vagina that can result in discharge, itching and pain. The cause is usually a change in the normal balance of vaginal bacteria or an infection. Vaginosis can also result from reduced estrogen levels after menopause.  The most common causes of vaginosis are:   Bacterial vaginosis which results from an overgrowth of one on several organisms that are normally present in your vagina.   Yeast infections which are caused by a naturally occurring fungus called candida.   Vaginal atrophy (atrophic vaginosis) which results from the thinning of the vagina from reduced estrogen levels after menopause.   Trichomoniasis which is caused by a parasite and is commonly transmitted by sexual intercourse.  Factors that increase your risk of developing vaginosis include: Medications, such as antibiotics and steroids Uncontrolled diabetes Use of hygiene products such as bubble bath, vaginal spray or vaginal deodorant Douching Wearing damp or tight-fitting clothing Using an intrauterine device (IUD) for birth control Hormonal changes, such as those associated with pregnancy, birth control pills or menopause Sexual activity Having a sexually transmitted infection  Your treatment plan is Metronidazole or Flagyl 500mg twice a day for 7 days.  I have electronically sent this prescription into the pharmacy that you have chosen.  Be sure to take all of the medication as directed. Stop taking any medication if you develop a rash, tongue swelling or shortness of breath. Mothers who are breast feeding should consider pumping and discarding their breast milk while on these antibiotics. However, there is no consensus that infant exposure at these doses would be harmful.  Remember that  medication creams can weaken latex condoms. .   HOME CARE:  Good hygiene may prevent some types of vaginosis from recurring and may relieve some symptoms:  Avoid baths, hot tubs and whirlpool spas. Rinse soap from your outer genital area after a shower, and dry the area well to prevent irritation. Don't use scented or harsh soaps, such as those with deodorant or antibacterial action. Avoid irritants. These include scented tampons and pads. Wipe from front to back after using the toilet. Doing so avoids spreading fecal bacteria to your vagina.  Other things that may help prevent vaginosis include:  Don't douche. Your vagina doesn't require cleansing other than normal bathing. Repetitive douching disrupts the normal organisms that reside in the vagina and can actually increase your risk of vaginal infection. Douching won't clear up a vaginal infection. Use a latex condom. Both female and female latex condoms may help you avoid infections spread by sexual contact. Wear cotton underwear. Also wear pantyhose with a cotton crotch. If you feel comfortable without it, skip wearing underwear to bed. Yeast thrives in moist environments Your symptoms should improve in the next day or two.  GET HELP RIGHT AWAY IF:  You have pain in your lower abdomen ( pelvic area or over your ovaries) You develop nausea or vomiting You develop a fever Your discharge changes or worsens You have persistent pain with intercourse You develop shortness of breath, a rapid pulse, or you faint.  These symptoms could be signs of problems or infections that need to be evaluated by a medical provider now.  MAKE SURE YOU   Understand these instructions. Will watch your condition. Will get help right   away if you are not doing well or get worse.  Thank you for choosing an e-visit.  Your e-visit answers were reviewed by a board certified advanced clinical practitioner to complete your personal care plan. Depending upon the  condition, your plan could have included both over the counter or prescription medications.  Please review your pharmacy choice. Make sure the pharmacy is open so you can pick up prescription now. If there is a problem, you may contact your provider through MyChart messaging and have the prescription routed to another pharmacy.  Your safety is important to us. If you have drug allergies check your prescription carefully.   For the next 24 hours you can use MyChart to ask questions about today's visit, request a non-urgent call back, or ask for a work or school excuse. You will get an email in the next two days asking about your experience. I hope that your e-visit has been valuable and will speed your recovery.    have provided 5 minutes of non face to face time during this encounter for chart review and documentation.   

## 2023-01-16 DIAGNOSIS — Z30433 Encounter for removal and reinsertion of intrauterine contraceptive device: Secondary | ICD-10-CM | POA: Diagnosis not present

## 2023-02-24 ENCOUNTER — Telehealth: Payer: BC Managed Care – PPO | Admitting: Emergency Medicine

## 2023-02-24 DIAGNOSIS — N760 Acute vaginitis: Secondary | ICD-10-CM

## 2023-02-24 MED ORDER — METRONIDAZOLE 500 MG PO TABS: 500.0000 mg | ORAL_TABLET | Freq: Two times a day (BID) | ORAL | 0 refills | Status: DC

## 2023-02-24 MED ORDER — FLUCONAZOLE 150 MG PO TABS: 150.0000 mg | ORAL_TABLET | Freq: Once | ORAL | 0 refills | Status: AC

## 2023-02-24 NOTE — Progress Notes (Signed)
E-Visit for Vaginal Symptoms  We are sorry that you are not feeling well. Here is how we plan to help! Based on what you shared with me it looks like you: May have a vaginosis due to bacteria  Vaginosis is an inflammation of the vagina that can result in discharge, itching and pain. The cause is usually a change in the normal balance of vaginal bacteria or an infection. Vaginosis can also result from reduced estrogen levels after menopause.  The most common causes of vaginosis are:   Bacterial vaginosis which results from an overgrowth of one on several organisms that are normally present in your vagina.   Yeast infections which are caused by a naturally occurring fungus called candida.   Vaginal atrophy (atrophic vaginosis) which results from the thinning of the vagina from reduced estrogen levels after menopause.   Trichomoniasis which is caused by a parasite and is commonly transmitted by sexual intercourse.  Factors that increase your risk of developing vaginosis include: Medications, such as antibiotics and steroids Uncontrolled diabetes Use of hygiene products such as bubble bath, vaginal spray or vaginal deodorant Douching Wearing damp or tight-fitting clothing Using an intrauterine device (IUD) for birth control Hormonal changes, such as those associated with pregnancy, birth control pills or menopause Sexual activity Having a sexually transmitted infection  Your treatment plan is Metronidazole or Flagyl 500mg  twice a day for 7 days.  I have electronically sent this prescription into the pharmacy that you have chosen.  I've sent a dose of Diflucan to be taken after the antibiotic in case it causes a yeast infection.  Be sure to take all of the medication as directed. Stop taking any medication if you develop a rash, tongue swelling or shortness of breath. Mothers who are breast feeding should consider pumping and discarding their breast milk while on these antibiotics. However,  there is no consensus that infant exposure at these doses would be harmful.  Remember that medication creams can weaken latex condoms. Marland Kitchen   HOME CARE:  Good hygiene may prevent some types of vaginosis from recurring and may relieve some symptoms:  Avoid baths, hot tubs and whirlpool spas. Rinse soap from your outer genital area after a shower, and dry the area well to prevent irritation. Don't use scented or harsh soaps, such as those with deodorant or antibacterial action. Avoid irritants. These include scented tampons and pads. Wipe from front to back after using the toilet. Doing so avoids spreading fecal bacteria to your vagina.  Other things that may help prevent vaginosis include:  Don't douche. Your vagina doesn't require cleansing other than normal bathing. Repetitive douching disrupts the normal organisms that reside in the vagina and can actually increase your risk of vaginal infection. Douching won't clear up a vaginal infection. Use a latex condom. Both female and female latex condoms may help you avoid infections spread by sexual contact. Wear cotton underwear. Also wear pantyhose with a cotton crotch. If you feel comfortable without it, skip wearing underwear to bed. Yeast thrives in [employer] Your symptoms should improve in the next day or two.  GET HELP RIGHT AWAY IF:  You have pain in your lower abdomen ( pelvic area or over your ovaries) You develop nausea or vomiting You develop a fever Your discharge changes or worsens You have persistent pain with intercourse You develop shortness of breath, a rapid pulse, or you faint.  These symptoms could be signs of problems or infections that need to be evaluated by a  medical provider now.  MAKE SURE YOU   Understand these instructions. Will watch your condition. Will get help right away if you are not doing well or get worse.  Thank you for choosing an e-visit.  Your e-visit answers were reviewed by a board  certified advanced clinical practitioner to complete your personal care plan. Depending upon the condition, your plan could have included both over the counter or prescription medications.  Please review your pharmacy choice. Make sure the pharmacy is open so you can pick up prescription now. If there is a problem, you may contact your provider through [employer] and have the prescription routed to another pharmacy.  Your safety is important to [place]. If you have drug allergies check your prescription carefully.   For the next 24 hours you can use MyChart to ask questions about today's visit, request a non-urgent call back, or ask for a work or school excuse. You will get an email in the next two days asking about your experience. I hope that your e-visit has been valuable and will speed your recovery.  Approximately 5 minutes was used in reviewing the patient's chart, questionnaire, prescribing medications, and documentation.

## 2023-03-05 ENCOUNTER — Telehealth: Payer: BC Managed Care – PPO | Admitting: Nurse Practitioner

## 2023-03-05 DIAGNOSIS — B379 Candidiasis, unspecified: Secondary | ICD-10-CM

## 2023-03-05 DIAGNOSIS — T3695XA Adverse effect of unspecified systemic antibiotic, initial encounter: Secondary | ICD-10-CM

## 2023-03-05 MED ORDER — FLUCONAZOLE 150 MG PO TABS: 150.0000 mg | ORAL_TABLET | Freq: Once | ORAL | 0 refills | Status: AC

## 2023-03-05 NOTE — Progress Notes (Signed)
E-Visit for Vaginal Symptoms We can prescribe one additional Diflucan but if symptoms persist we recommend in person evaluation for testing    Based on what you shared with me it looks like you: May have a yeast vaginosis  Vaginosis is an inflammation of the vagina that can result in discharge, itching and pain. The cause is usually a change in the normal balance of vaginal bacteria or an infection. Vaginosis can also result from reduced estrogen levels after menopause.  The most common causes of vaginosis are:   Bacterial vaginosis which results from an overgrowth of one on several organisms that are normally present in your vagina.   Yeast infections which are caused by a naturally occurring fungus called candida.   Vaginal atrophy (atrophic vaginosis) which results from the thinning of the vagina from reduced estrogen levels after menopause.   Trichomoniasis which is caused by a parasite and is commonly transmitted by sexual intercourse.  Factors that increase your risk of developing vaginosis include: Medications, such as antibiotics and steroids Uncontrolled diabetes Use of hygiene products such as bubble bath, vaginal spray or vaginal deodorant Douching Wearing damp or tight-fitting clothing Using an intrauterine device (IUD) for birth control Hormonal changes, such as those associated with pregnancy, birth control pills or menopause Sexual activity Having a sexually transmitted infection  Your treatment plan is A single Diflucan (fluconazole) 150mg  tablet once.  I have electronically sent this prescription into the pharmacy that you have chosen.  Be sure to take all of the medication as directed. Stop taking any medication if you develop a rash, tongue swelling or shortness of breath. Mothers who are breast feeding should consider pumping and discarding their breast milk while on these antibiotics. However, there is no consensus that infant exposure at these doses would be  harmful.  Remember that medication creams can weaken latex condoms. Marland Kitchen   HOME CARE:  Good hygiene may prevent some types of vaginosis from recurring and may relieve some symptoms:  Avoid baths, hot tubs and whirlpool spas. Rinse soap from your outer genital area after a shower, and dry the area well to prevent irritation. Don't use scented or harsh soaps, such as those with deodorant or antibacterial action. Avoid irritants. These include scented tampons and pads. Wipe from front to back after using the toilet. Doing so avoids spreading fecal bacteria to your vagina.  Other things that may help prevent vaginosis include:  Don't douche. Your vagina doesn't require cleansing other than normal bathing. Repetitive douching disrupts the normal organisms that reside in the vagina and can actually increase your risk of vaginal infection. Douching won't clear up a vaginal infection. Use a latex condom. Both female and female latex condoms may help you avoid infections spread by sexual contact. Wear cotton underwear. Also wear pantyhose with a cotton crotch. If you feel comfortable without it, skip wearing underwear to bed. Yeast thrives in [employer] Your symptoms should improve in the next day or two.  GET HELP RIGHT AWAY IF:  You have pain in your lower abdomen ( pelvic area or over your ovaries) You develop nausea or vomiting You develop a fever Your discharge changes or worsens You have persistent pain with intercourse You develop shortness of breath, a rapid pulse, or you faint.  These symptoms could be signs of problems or infections that need to be evaluated by a medical provider now.  MAKE SURE YOU   Understand these instructions. Will watch your condition. Will get help right away if  you are not doing well or get worse.  Thank you for choosing an e-visit.  Your e-visit answers were reviewed by a board certified advanced clinical practitioner to complete your personal care  plan. Depending upon the condition, your plan could have included both over the counter or prescription medications.  Please review your pharmacy choice. Make sure the pharmacy is open so you can pick up prescription now. If there is a problem, you may contact your provider through [employer] and have the prescription routed to another pharmacy.  Your safety is important to [place]. If you have drug allergies check your prescription carefully.   For the next 24 hours you can use MyChart to ask questions about today's visit, request a non-urgent call back, or ask for a work or school excuse. You will get an email in the next two days asking about your experience. I hope that your e-visit has been valuable and will speed your recovery.   Meds ordered this encounter  Medications   fluconazole (DIFLUCAN) 150 MG tablet    Sig: Take 1 tablet (150 mg total) by mouth once for 1 dose.    Dispense:  1 tablet    Refill:  0     I spent approximately 5 minutes reviewing the patient's history, current symptoms and coordinating their care today.

## 2023-03-08 ENCOUNTER — Telehealth: Payer: BC Managed Care – PPO | Admitting: Physician Assistant

## 2023-03-08 DIAGNOSIS — B379 Candidiasis, unspecified: Secondary | ICD-10-CM

## 2023-03-08 MED ORDER — FLUCONAZOLE 150 MG PO TABS: ORAL_TABLET | ORAL | 0 refills | Status: DC

## 2023-03-08 NOTE — Progress Notes (Signed)

## 2023-03-08 NOTE — Progress Notes (Signed)
I have spent 5 minutes in review of e-visit questionnaire, review and updating patient chart, medical decision making and response to patient.   Mia Milan Cody Jacklynn Dehaas, PA-C    

## 2023-03-11 ENCOUNTER — Ambulatory Visit
Admission: RE | Admit: 2023-03-11 | Discharge: 2023-03-11 | Disposition: A | Discharge status: Home / Self Care | Payer: BC Managed Care – PPO | Source: Ambulatory Visit | Attending: Obstetrics and Gynecology | Admitting: Obstetrics and Gynecology

## 2023-03-11 DIAGNOSIS — Z1231 Encounter for screening mammogram for malignant neoplasm of breast: Secondary | ICD-10-CM | POA: Diagnosis not present

## 2023-03-11 DIAGNOSIS — B3731 Acute candidiasis of vulva and vagina: Secondary | ICD-10-CM | POA: Diagnosis not present

## 2023-03-11 DIAGNOSIS — N9089 Other specified noninflammatory disorders of vulva and perineum: Secondary | ICD-10-CM | POA: Diagnosis not present

## 2023-04-03 DIAGNOSIS — F900 Attention-deficit hyperactivity disorder, predominantly inattentive type: Secondary | ICD-10-CM | POA: Diagnosis not present

## 2023-06-27 ENCOUNTER — Telehealth: Payer: BC Managed Care – PPO | Admitting: Physician Assistant

## 2023-06-27 DIAGNOSIS — N750 Cyst of Bartholin's gland: Secondary | ICD-10-CM | POA: Diagnosis not present

## 2023-06-27 DIAGNOSIS — T3695XA Adverse effect of unspecified systemic antibiotic, initial encounter: Secondary | ICD-10-CM

## 2023-06-27 DIAGNOSIS — B379 Candidiasis, unspecified: Secondary | ICD-10-CM | POA: Diagnosis not present

## 2023-06-27 MED ORDER — FLUCONAZOLE 150 MG PO TABS: 150.0000 mg | ORAL_TABLET | ORAL | 0 refills | Status: DC | PRN

## 2023-06-27 MED ORDER — DOXYCYCLINE HYCLATE 100 MG PO TABS: 100.0000 mg | ORAL_TABLET | Freq: Two times a day (BID) | ORAL | 0 refills | Status: DC

## 2023-06-27 NOTE — Progress Notes (Signed)
 E-Visit for Vaginal Symptoms  We are sorry that you are not feeling well. Here is how we plan to help! Based on what you shared with me it looks like you: a Bartholin Cyst.  I have prescribed Doxycycline  100mg  Take 1 tablet twice daily for 7 days.   Warm compresses, epsom salt soaks (Sitz baths) can help as well.  Diflucan  given as prophylaxis as patient tends to get vaginal yeast infections with antibiotic use.  HOME CARE:  Good hygiene may prevent some types of vaginosis from recurring and may relieve some symptoms:  Avoid baths, hot tubs and whirlpool spas. Rinse soap from your outer genital area after a shower, and dry the area well to prevent irritation. Don't use scented or harsh soaps, such as those with deodorant or antibacterial action. Avoid irritants. These include scented tampons and pads. Wipe from front to back after using the toilet. Doing so avoids spreading fecal bacteria to your vagina.  Other things that may help prevent vaginosis include:  Don't douche. Your vagina doesn't require cleansing other than normal bathing. Repetitive douching disrupts the normal organisms that reside in the vagina and can actually increase your risk of vaginal infection. Douching won't clear up a vaginal infection. Use a latex condom. Both female and female latex condoms may help you avoid infections spread by sexual contact. Wear cotton underwear. Also wear pantyhose with a cotton crotch. If you feel comfortable without it, skip wearing underwear to bed. Yeast thrives in [employer] Your symptoms should improve in the next day or two.  GET HELP RIGHT AWAY IF:  You have pain in your lower abdomen ( pelvic area or over your ovaries) You develop nausea or vomiting You develop a fever Your discharge changes or worsens You have persistent pain with intercourse You develop shortness of breath, a rapid pulse, or you faint.  These symptoms could be signs of problems or infections that  need to be evaluated by a medical provider now.  MAKE SURE YOU   Understand these instructions. Will watch your condition. Will get help right away if you are not doing well or get worse.  Thank you for choosing an e-visit.  Your e-visit answers were reviewed by a board certified advanced clinical practitioner to complete your personal care plan. Depending upon the condition, your plan could have included both over the counter or prescription medications.  Please review your pharmacy choice. Make sure the pharmacy is open so you can pick up prescription now. If there is a problem, you may contact your provider through [employer] and have the prescription routed to another pharmacy.  Your safety is important to us . If you have drug allergies check your prescription carefully.   For the next 24 hours you can use MyChart to ask questions about today's visit, request a non-urgent call back, or ask for a work or school excuse. You will get an email in the next two days asking about your experience. I hope that your e-visit has been valuable and will speed your recovery.   I have spent 5 minutes in review of e-visit questionnaire, review and updating patient chart, medical decision making and response to patient.   Delon CHRISTELLA Dickinson, PA-C

## 2023-07-31 ENCOUNTER — Encounter: Payer: Self-pay | Admitting: Family

## 2023-08-05 ENCOUNTER — Other Ambulatory Visit: Payer: Self-pay | Admitting: Family

## 2023-08-05 ENCOUNTER — Encounter: Payer: Self-pay | Admitting: Family

## 2023-08-11 ENCOUNTER — Other Ambulatory Visit: Payer: Self-pay | Admitting: Family

## 2023-08-11 ENCOUNTER — Other Ambulatory Visit (HOSPITAL_COMMUNITY): Payer: Self-pay

## 2023-08-11 ENCOUNTER — Telehealth: Payer: Self-pay

## 2023-08-11 DIAGNOSIS — E282 Polycystic ovarian syndrome: Secondary | ICD-10-CM | POA: Insufficient documentation

## 2023-08-11 MED ORDER — TIRZEPATIDE 2.5 MG/0.5ML ~~LOC~~ SOAJ: 2.5000 mg | SUBCUTANEOUS | 1 refills | Status: AC

## 2023-08-11 NOTE — Telephone Encounter (Signed)
 Pharmacy Patient Advocate Encounter   Received notification from CoverMyMeds that prior authorization for Mounjaro 2.5MG /0.5ML auto-injectors is required/requested.   Insurance verification completed.   The patient is insured through [facility] .   Per test claim: PA required; PA submitted to above mentioned insurance via CoverMyMeds Key/confirmation #/EOC (Key: [id number]) Status is pending   Used ICD for PCOS

## 2023-08-12 NOTE — Telephone Encounter (Signed)
 Pharmacy Patient Advocate Encounter  Received notification from [facility] that Prior Authorization for  [facility] 2.5MG /0.5ML auto-injectors  has been DENIED.  Full denial letter will be uploaded to the media tab. See denial reason below.    Patients plan only covers Mounjaro under the dx of Type 2 diabetes. Please see the previous encounter as the pt inquired about this Rx for PCOS           PA #/Case ID/Reference #: (Key: [id number])

## 2023-08-12 NOTE — Telephone Encounter (Signed)
 Pt has been notified.

## 2023-08-25 ENCOUNTER — Telehealth: Admitting: Physician Assistant

## 2023-08-25 DIAGNOSIS — N760 Acute vaginitis: Secondary | ICD-10-CM

## 2023-08-25 DIAGNOSIS — B9689 Other specified bacterial agents as the cause of diseases classified elsewhere: Secondary | ICD-10-CM

## 2023-08-25 MED ORDER — METRONIDAZOLE 500 MG PO TABS: 500.0000 mg | ORAL_TABLET | Freq: Two times a day (BID) | ORAL | 0 refills | Status: AC

## 2023-08-25 NOTE — Progress Notes (Signed)

## 2023-11-03 ENCOUNTER — Telehealth: Admitting: Family Medicine

## 2023-11-03 DIAGNOSIS — N760 Acute vaginitis: Secondary | ICD-10-CM | POA: Diagnosis not present

## 2023-11-03 DIAGNOSIS — T3695XA Adverse effect of unspecified systemic antibiotic, initial encounter: Secondary | ICD-10-CM | POA: Diagnosis not present

## 2023-11-03 DIAGNOSIS — B379 Candidiasis, unspecified: Secondary | ICD-10-CM

## 2023-11-03 DIAGNOSIS — B9689 Other specified bacterial agents as the cause of diseases classified elsewhere: Secondary | ICD-10-CM

## 2023-11-03 MED ORDER — METRONIDAZOLE 500 MG PO TABS: 500.0000 mg | ORAL_TABLET | Freq: Two times a day (BID) | ORAL | 0 refills | Status: AC

## 2023-11-03 MED ORDER — FLUCONAZOLE 150 MG PO TABS: 150.0000 mg | ORAL_TABLET | ORAL | 0 refills | Status: DC | PRN

## 2023-11-03 NOTE — Progress Notes (Signed)

## 2023-11-27 DIAGNOSIS — Z1329 Encounter for screening for other suspected endocrine disorder: Secondary | ICD-10-CM | POA: Diagnosis not present

## 2023-11-27 DIAGNOSIS — Z01419 Encounter for gynecological examination (general) (routine) without abnormal findings: Secondary | ICD-10-CM | POA: Diagnosis not present

## 2023-11-27 DIAGNOSIS — Z131 Encounter for screening for diabetes mellitus: Secondary | ICD-10-CM | POA: Diagnosis not present

## 2023-11-27 DIAGNOSIS — Z13220 Encounter for screening for lipoid disorders: Secondary | ICD-10-CM | POA: Diagnosis not present

## 2023-11-27 DIAGNOSIS — Z1331 Encounter for screening for depression: Secondary | ICD-10-CM | POA: Diagnosis not present

## 2023-11-27 DIAGNOSIS — R5383 Other fatigue: Secondary | ICD-10-CM | POA: Diagnosis not present

## 2023-11-27 DIAGNOSIS — Z113 Encounter for screening for infections with a predominantly sexual mode of transmission: Secondary | ICD-10-CM | POA: Diagnosis not present

## 2023-11-27 DIAGNOSIS — E538 Deficiency of other specified B group vitamins: Secondary | ICD-10-CM | POA: Diagnosis not present

## 2023-12-16 ENCOUNTER — Ambulatory Visit (INDEPENDENT_AMBULATORY_CARE_PROVIDER_SITE_OTHER): Admitting: Family

## 2023-12-16 ENCOUNTER — Encounter: Payer: Self-pay | Admitting: Family

## 2023-12-16 VITALS — BP 120/78 | HR 65 | Temp 98.5°F | Ht 66.0 in | Wt 194.6 lb

## 2023-12-16 DIAGNOSIS — Z23 Encounter for immunization: Secondary | ICD-10-CM

## 2023-12-16 DIAGNOSIS — R7303 Prediabetes: Secondary | ICD-10-CM | POA: Diagnosis not present

## 2023-12-16 DIAGNOSIS — E669 Obesity, unspecified: Secondary | ICD-10-CM

## 2023-12-16 DIAGNOSIS — E785 Hyperlipidemia, unspecified: Secondary | ICD-10-CM | POA: Insufficient documentation

## 2023-12-16 DIAGNOSIS — Z720 Tobacco use: Secondary | ICD-10-CM

## 2023-12-16 MED ORDER — CONTRAVE 8-90 MG PO TB12: ORAL_TABLET | ORAL | 0 refills | Status: AC

## 2023-12-16 NOTE — Assessment & Plan Note (Signed)
 Using PREVENT ASCVD This individual has an estimated 30-year risk of ASCVD = 7.5%;This individual has an estimated 10-year risk of ASCVD = 1.2%

## 2023-12-16 NOTE — Patient Instructions (Signed)
Please note you may not take this medication with a history of uncontrolled hypertension, seizure disorder, alcohol use, history of opioid abuse.  This medication will reserve a narcotic ; this includes a cough syrup with codeine use.  This medication is not safe in pregnancy   Start contrave:   One tablet (naltrexone 8 mg/bupropion 90 mg) once daily in the morning for 1 week; increase as tolerated in weekly intervals: 1 tablet twice daily for 1 week; then 2 tablets in the morning and 1 tablet in the evening for 1 week; and then 2 tablets twice daily (maximum dose: 4 tablets/day)  We will consider discontinuation if weight loss is less than 4% to 5% of baseline after 3 months.  Naltrexone; Bupropion Extended-Release Tablets What is this medication? NALTREXONE; BUPROPION (nal TREX one; byoo PROE pee on) promotes weight loss. It may also be used to maintain weight loss. It works by decreasing appetite. Changes to diet and exercise are often combined with this medication. This medicine may be used for other purposes; ask your health care provider or pharmacist if you have questions. COMMON BRAND NAME(S): Contrave What should I tell my care team before I take this medication? They need to know if you have any of these conditions: An eating disorder, such as anorexia or bulimia Diabetes Depression Frequently drink alcohol Glaucoma Head injury Heart disease High blood pressure History of a tumor or infection of your brain or spine History of heart attack or stroke History of irregular heartbeat History of substance use disorder or alcohol use disorder Kidney disease Liver disease Low levels of sodium in the blood Mental health condition Seizures Suicidal thoughts, plans, or attempt by you or a family member Taken an MAOI like Carbex, Eldepryl, Marplan, Nardil, or Parnate in last 14 days An unusual or allergic reaction to bupropion, naltrexone, other medications, foods, dyes, or  preservatives Breast-feeding Pregnant or trying to become pregnant How should I use this medication? Take this medication by mouth with a full glass of water. Take it as directed on the prescription label at the same time every day. Do not cut, crush, or chew this medication. Swallow the tablets whole. You can take it with or without food. Do not take with high-fat meals as this may increase your risk of seizures. Keep taking it unless your care team tells you to stop. A special MedGuide will be given to you by the pharmacist with each prescription and refill. Be sure to read this information carefully each time. Talk to your care team about the use of this medication in children. Special care may be needed. Overdosage: If you think you have taken too much of this medicine contact a poison control center or emergency room at once. NOTE: This medicine is only for you. Do not share this medicine with others. What if I miss a dose? If you miss a dose, skip it. Take your next dose at the normal time. Do not take extra or 2 doses at the same time to make up for the missed dose. What may interact with this medication? Do not take this medication with any of the following: Any medications used to stop taking opioids, such as methadone or buprenorphine Linezolid MAOIs, such as Carbex, Eldepryl, Marplan, Nardil, and Parnate Methylene blue (injected into a vein) Opioid medications Other medications that contain bupropion, such as Zyban or Wellbutrin This medication may also interact with the following: Alcohol Certain medications for blood pressure, such as metoprolol, propranolol Certain medications for   depression, anxiety, or mental health conditions Certain medications for HIV or hepatitis Certain medications for irregular heart beat, such as propafenone, flecainide Certain medications for Parkinson disease, such as amantadine, levodopa Certain medications for seizures, such as carbamazepine,  phenytoin, phenobarbital Certain medications for sleep Cimetidine Clopidogrel Cyclophosphamide Digoxin Disulfiram Furazolidone Isoniazid Nicotine Orphenadrine Procarbazine Steroid medications, such as prednisone or cortisone Stimulant medications for attention disorders, weight loss, or to stay awake Tamoxifen Theophylline Thiotepa Ticlopidine Tramadol Warfarin This list may not describe all possible interactions. Give your health care provider a list of all the medicines, herbs, non-prescription drugs, or dietary supplements you use. Also tell them if you smoke, drink alcohol, or use illegal drugs. Some items may interact with your medicine. What should I watch for while using this medication? Visit your care team for regular checks on your progress. This medication may cause serious skin reactions. They can happen weeks to months after starting the medication. Contact your care team right away if you notice fevers or flu-like symptoms with a rash. The rash may be red or purple and then turn into blisters or peeling of the skin. Or, you might notice a red rash with swelling of the face, lips, or lymph nodes in your neck or under your arms. This medication may affect blood sugar. Ask your care team if changes in diet or medications are needed if you have diabetes. Patients and their families should watch out for new or worsening depression or thoughts of suicide. This includes sudden changes in mood, behaviors, or thoughts. These changes can happen at any time but are more common in the beginning of treatment or after a change in dose. Call your care team right away if you experience these thoughts or worsening depression. Avoid alcoholic drinks while taking this medication. Drinking large amounts of alcoholic beverages, using sleeping or anxiety medications, or quickly stopping the use of these agents while taking this medication may increase your risk for a seizure. Do not drive or use  heavy machinery until you know how this medication affects you. This medication can impair your ability to perform these tasks. Inform your care team if you wish to become pregnant or think you might be pregnant. Losing weight while pregnant is not advised and may cause harm to the unborn child. Talk to your care team for more information. What side effects may I notice from receiving this medication? Side effects that you should report to your care team as soon as possible: Allergic reactions--skin rash, itching, hives, swelling of the face, lips, tongue, or throat Fast or irregular heartbeat Increase in blood pressure Liver injury--right upper belly pain, loss of appetite, nausea, light-colored stool, dark yellow or brown urine, yellowing skin or eyes, unusual weakness or fatigue Mood and behavior changes--anxiety, nervousness, confusion, hallucinations, irritability, hostility, thoughts of suicide or self-harm, worsening mood, feelings of depression Redness, blistering, peeling, or loosening of the skin, including inside the mouth Seizures Sudden eye pain or change in vision such as blurry vision, seeing halos around lights, vision loss Side effects that usually do not require medical attention (report to your care team if they continue or are bothersome): Constipation Dizziness Dry mouth Fatigue Headache Nausea Trouble sleeping Vomiting This list may not describe all possible side effects. Call your doctor for medical advice about side effects. You may report side effects to FDA at 1-800-FDA-1088. Where should I keep my medication? Keep out of the reach of children and pets. Store between 15 and 30 degrees C (59   and 86 degrees F). Get rid of any unused medication after the expiration date. To get rid of medications that are no longer needed or have expired: Take the medication to a medication take-back program. Check with your pharmacy or law enforcement to find a location. If you  cannot return the medication, check the label or package insert to see if the medication should be thrown out in the garbage or flushed down the toilet. If you are not sure, ask your care team. If it is safe to put it in the trash, pour the medication out of the container. Mix the medication with cat litter, dirt, coffee grounds, or other unwanted substance. Seal the mixture in a bag or container. Put it in the trash. NOTE: This sheet is a summary. It may not cover all possible information. If you have questions about this medicine, talk to your doctor, pharmacist, or health care provider.  2023 [employer]/Gold Standard (2021-06-11 00:00:00)  

## 2023-12-16 NOTE — Progress Notes (Unsigned)
 Assessment & Plan:  Prediabetes -     Contrave ; Start 1 tablet every morning for 7 days, then 1 tablet twice daily for 7 days, then 2 tablets every morning and one every evening  Dispense: 120 tablet; Refill: 0  Encounter for immunization -     Measles/Mumps/Rubella Immunity  Hyperlipidemia, unspecified hyperlipidemia type Assessment & Plan:  Using PREVENT ASCVD This individual has an estimated 30-year risk of ASCVD = 7.5%;This individual has an estimated 10-year risk of ASCVD = 1.2% Discussed low ASCVD risk and lifestyle modification.    Obesity (BMI 30-39.9) Assessment & Plan: Patient has no history of uncontrolled hypertension, seizure disorder, alcohol abuse, opioid use or opioid addiction.  Counseled on side effects including headache, insomnia.  Counseled on opioid reversal if she is ever prescribed an opioid for pain, codeine cough syrup.   Explained titration of Contrave . Will continue for 3 months. If 5% of bodyweight is not lost, will discontinue medication. Patient verbalized understanding of all.    Orders: -     Contrave ; Start 1 tablet every morning for 7 days, then 1 tablet twice daily for 7 days, then 2 tablets every morning and one every evening  Dispense: 120 tablet; Refill: 0  Tobacco abuse Assessment & Plan: Discussed trial of wellbutrin  150mg  for appetite suppression, smoking cessation. We discussed favoring contrave  for weight loss; consider wellbutrin  if not approved for smoking cessation/suppression of appetite.       Return precautions given.   Risks, benefits, and alternatives of the medications and treatment plan prescribed today were discussed, and patient expressed understanding.   Education regarding symptom management and diagnosis given to patient on AVS either electronically or printed.  No follow-ups on file.  Rollene Northern, FNP  Subjective:    Patient ID: Rollene PARAS Mikula, female    DOB: 1980-08-28, 43 y.o.   MRN: [id number]  CC:  Malasia Torain Babineau is a 43 y.o. female who presents today for follow up.   HPI: HPI  Discussed the use of AI scribe software for clinical note transcription with the patient, who gave verbal consent to proceed.  History of Present Illness   Arian Mcquitty Espinal Jan is a 43 year old female who presents with concerns about high cholesterol and weight gain.  She recently had an OBGYN physical that revealed high cholesterol levels and an elevated A1c. She has gained approximately 15 to 20 pounds recently. Her HDL cholesterol is 51. She is not diabetic, and her blood pressure readings have been consistently good.  There is a family history of high cholesterol, with her mother and brother both having elevated levels. Her brother, who is tall and skinny, also has high cholesterol.  She has attempted weight loss medications in the past, including Mounjaro  and Zeb Bound, but they were not approved by her insurance. She has previously tried Chantix for smoking cessation but experienced adverse effects such as nightmares.  She is currently considering Wellbutrin  for weight loss and smoking cessation, having tried it in the past without severe side effects. She smokes and has attempted smoking cessation in the past.     No h/o seziure, anorexia, bulmnia No h/o opioid abuse.   Very occasional alcohol  Allergies: Penicillins and Chantix [varenicline] Current Outpatient Medications on File Prior to Visit  Medication Sig Dispense Refill   cyanocobalamin  (,VITAMIN B-12,) 1000 MCG/ML injection Inject 1 mL (1,000 mcg total) into the muscle once. 1 mL 0   levonorgestrel  (MIRENA ) 20 MCG/24HR IUD 1 each by  Intrauterine route once.     SUMAtriptan (IMITREX) 100 MG tablet Take 100 mg by mouth.     tirzepatide  (MOUNJARO ) 2.5 MG/0.5ML Pen Inject 2.5 mg into the skin once a week. 2 mL 1   No current facility-administered medications on file prior to visit.    Review of Systems  Constitutional:  Negative for  chills and fever.  Respiratory:  Negative for cough.   Cardiovascular:  Negative for chest pain and palpitations.  Gastrointestinal:  Negative for nausea and vomiting.      Objective:    BP 120/78   Pulse 65   Temp 98.5 F (36.9 C) (Oral)   Ht 5' 6 (1.676 m)   Wt 194 lb 9.6 oz (88.3 kg)   LMP  (LMP Unknown)   SpO2 98%   BMI 31.41 kg/m  BP Readings from Last 3 Encounters:  12/16/23 120/78  11/29/22 116/80  11/07/21 110/70   Wt Readings from Last 3 Encounters:  12/16/23 194 lb 9.6 oz (88.3 kg)  11/29/22 195 lb 9.6 oz (88.7 kg)  02/04/22 187 lb (84.8 kg)    Physical Exam Vitals reviewed.  Constitutional:      Appearance: She is well-developed.  Eyes:     Conjunctiva/sclera: Conjunctivae normal.  Cardiovascular:     Rate and Rhythm: Normal rate and regular rhythm.     Pulses: Normal pulses.     Heart sounds: Normal heart sounds.  Pulmonary:     Effort: Pulmonary effort is normal.     Breath sounds: Normal breath sounds. No wheezing, rhonchi or rales.  Skin:    General: Skin is warm and dry.  Neurological:     Mental Status: She is alert.  Psychiatric:        Speech: Speech normal.        Behavior: Behavior normal.        Thought Content: Thought content normal.

## 2023-12-17 LAB — MEASLES/MUMPS/RUBELLA IMMUNITY
Mumps IgG: <9 [AU]/ml — ABNORMAL LOW
Rubella: 3.92 {index}
Rubeola IgG: 77.7 [AU]/ml

## 2023-12-17 NOTE — Assessment & Plan Note (Addendum)
 Discussed trial of wellbutrin  150mg  for appetite suppression, smoking cessation. We discussed favoring contrave  for weight loss; consider wellbutrin  if not approved for smoking cessation/suppression of appetite.

## 2023-12-17 NOTE — Assessment & Plan Note (Signed)
  Using PREVENT ASCVD This individual has an estimated 30-year risk of ASCVD = 7.5%;This individual has an estimated 10-year risk of ASCVD = 1.2% Discussed low ASCVD risk and lifestyle modification.

## 2023-12-17 NOTE — Assessment & Plan Note (Signed)
Patient has no history of uncontrolled hypertension, seizure disorder, alcohol abuse, opioid use or opioid addiction.  Counseled on side effects including headache, insomnia.  Counseled on opioid reversal if she is ever prescribed an opioid for pain, codeine cough syrup.   Explained titration of Contrave. Will continue for 3 months. If 5% of bodyweight is not lost, will discontinue medication. Patient verbalized understanding of all.

## 2023-12-22 ENCOUNTER — Ambulatory Visit: Payer: Self-pay | Admitting: Family

## 2023-12-22 DIAGNOSIS — F900 Attention-deficit hyperactivity disorder, predominantly inattentive type: Secondary | ICD-10-CM | POA: Diagnosis not present

## 2023-12-25 ENCOUNTER — Ambulatory Visit: Admitting: Family

## 2024-01-29 DIAGNOSIS — F900 Attention-deficit hyperactivity disorder, predominantly inattentive type: Secondary | ICD-10-CM | POA: Diagnosis not present

## 2024-02-06 ENCOUNTER — Other Ambulatory Visit: Payer: Self-pay | Admitting: Obstetrics and Gynecology

## 2024-02-06 DIAGNOSIS — Z1231 Encounter for screening mammogram for malignant neoplasm of breast: Secondary | ICD-10-CM

## 2024-03-11 ENCOUNTER — Ambulatory Visit
Admission: RE | Admit: 2024-03-11 | Discharge: 2024-03-11 | Disposition: A | Discharge status: Home / Self Care | Source: Ambulatory Visit | Attending: Obstetrics and Gynecology | Admitting: Obstetrics and Gynecology

## 2024-03-11 DIAGNOSIS — Z1231 Encounter for screening mammogram for malignant neoplasm of breast: Secondary | ICD-10-CM | POA: Diagnosis not present

## 2024-05-11 DIAGNOSIS — F900 Attention-deficit hyperactivity disorder, predominantly inattentive type: Secondary | ICD-10-CM | POA: Diagnosis not present

## 2024-06-28 ENCOUNTER — Ambulatory Visit: Admitting: Family
